# Patient Record
Sex: Female | Born: 1970 | Race: White | Hispanic: No | Smoking: Former smoker
Health system: Southern US, Community
[De-identification: ages and names within clinical notes are randomized; demographics above are authoritative.]

## PROBLEM LIST (undated history)

## (undated) DIAGNOSIS — I509 Heart failure, unspecified: Secondary | ICD-10-CM

## (undated) DIAGNOSIS — K589 Irritable bowel syndrome without diarrhea: Secondary | ICD-10-CM

## (undated) DIAGNOSIS — T7840XA Allergy, unspecified, initial encounter: Secondary | ICD-10-CM

## (undated) DIAGNOSIS — F1910 Other psychoactive substance abuse, uncomplicated: Secondary | ICD-10-CM

## (undated) DIAGNOSIS — F329 Major depressive disorder, single episode, unspecified: Secondary | ICD-10-CM

## (undated) DIAGNOSIS — F17200 Nicotine dependence, unspecified, uncomplicated: Secondary | ICD-10-CM

## (undated) DIAGNOSIS — Z8614 Personal history of Methicillin resistant Staphylococcus aureus infection: Secondary | ICD-10-CM

## (undated) DIAGNOSIS — IMO0002 Reserved for concepts with insufficient information to code with codable children: Secondary | ICD-10-CM

## (undated) DIAGNOSIS — E119 Type 2 diabetes mellitus without complications: Secondary | ICD-10-CM

## (undated) DIAGNOSIS — J309 Allergic rhinitis, unspecified: Secondary | ICD-10-CM

## (undated) DIAGNOSIS — Z87442 Personal history of urinary calculi: Secondary | ICD-10-CM

## (undated) DIAGNOSIS — D649 Anemia, unspecified: Secondary | ICD-10-CM

## (undated) DIAGNOSIS — G4733 Obstructive sleep apnea (adult) (pediatric): Secondary | ICD-10-CM

## (undated) DIAGNOSIS — F419 Anxiety disorder, unspecified: Secondary | ICD-10-CM

## (undated) DIAGNOSIS — I10 Essential (primary) hypertension: Secondary | ICD-10-CM

## (undated) DIAGNOSIS — B1710 Acute hepatitis C without hepatic coma: Secondary | ICD-10-CM

## (undated) DIAGNOSIS — F3289 Other specified depressive episodes: Secondary | ICD-10-CM

## (undated) DIAGNOSIS — G4730 Sleep apnea, unspecified: Secondary | ICD-10-CM

## (undated) DIAGNOSIS — D72829 Elevated white blood cell count, unspecified: Secondary | ICD-10-CM

## (undated) DIAGNOSIS — E871 Hypo-osmolality and hyponatremia: Secondary | ICD-10-CM

## (undated) DIAGNOSIS — S91331A Puncture wound without foreign body, right foot, initial encounter: Secondary | ICD-10-CM

## (undated) DIAGNOSIS — G43909 Migraine, unspecified, not intractable, without status migrainosus: Secondary | ICD-10-CM

## (undated) HISTORY — DX: Type 2 diabetes mellitus without complications: E11.9

## (undated) HISTORY — DX: Anemia, unspecified: D64.9

## (undated) HISTORY — DX: Acute hepatitis C without hepatic coma: B17.10

## (undated) HISTORY — DX: Major depressive disorder, single episode, unspecified: F32.9

## (undated) HISTORY — DX: Migraine, unspecified, not intractable, without status migrainosus: G43.909

## (undated) HISTORY — DX: Other psychoactive substance abuse, uncomplicated: F19.10

## (undated) HISTORY — DX: Reserved for concepts with insufficient information to code with codable children: IMO0002

## (undated) HISTORY — DX: Nicotine dependence, unspecified, uncomplicated: F17.200

## (undated) HISTORY — DX: Other specified depressive episodes: F32.89

## (undated) HISTORY — DX: Hypo-osmolality and hyponatremia: E87.1

## (undated) HISTORY — DX: Personal history of urinary calculi: Z87.442

## (undated) HISTORY — DX: Irritable bowel syndrome, unspecified: K58.9

## (undated) HISTORY — DX: Sleep apnea, unspecified: G47.30

## (undated) HISTORY — DX: Allergy, unspecified, initial encounter: T78.40XA

## (undated) HISTORY — DX: Allergic rhinitis, unspecified: J30.9

## (undated) HISTORY — DX: Essential (primary) hypertension: I10

## (undated) HISTORY — DX: Puncture wound without foreign body, right foot, initial encounter: S91.331A

## (undated) HISTORY — DX: Elevated white blood cell count, unspecified: D72.829

## (undated) HISTORY — DX: Anxiety disorder, unspecified: F41.9

## (undated) HISTORY — DX: Obstructive sleep apnea (adult) (pediatric): G47.33

---

## 1993-02-20 HISTORY — PX: TUBAL LIGATION: SHX77

## 1997-02-20 HISTORY — PX: LIVER BIOPSY: SHX301

## 2001-11-07 ENCOUNTER — Emergency Department (HOSPITAL_COMMUNITY): Admission: EM | Admit: 2001-11-07 | Discharge: 2001-11-08 | Payer: Self-pay | Admitting: Emergency Medicine

## 2002-02-14 ENCOUNTER — Encounter: Payer: Self-pay | Admitting: Emergency Medicine

## 2002-02-14 ENCOUNTER — Emergency Department (HOSPITAL_COMMUNITY): Admission: EM | Admit: 2002-02-14 | Discharge: 2002-02-14 | Payer: Self-pay | Admitting: Emergency Medicine

## 2002-03-01 ENCOUNTER — Emergency Department (HOSPITAL_COMMUNITY): Admission: EM | Admit: 2002-03-01 | Discharge: 2002-03-01 | Payer: Self-pay | Admitting: Emergency Medicine

## 2002-05-18 ENCOUNTER — Emergency Department (HOSPITAL_COMMUNITY): Admission: EM | Admit: 2002-05-18 | Discharge: 2002-05-18 | Payer: Self-pay | Admitting: *Deleted

## 2002-09-21 ENCOUNTER — Encounter (INDEPENDENT_AMBULATORY_CARE_PROVIDER_SITE_OTHER): Payer: Self-pay | Admitting: *Deleted

## 2002-10-02 ENCOUNTER — Emergency Department (HOSPITAL_COMMUNITY): Admission: EM | Admit: 2002-10-02 | Discharge: 2002-10-02 | Payer: Self-pay | Admitting: Emergency Medicine

## 2002-11-06 ENCOUNTER — Encounter: Admission: RE | Admit: 2002-11-06 | Discharge: 2002-11-06 | Payer: Self-pay | Admitting: Family Medicine

## 2002-11-24 ENCOUNTER — Encounter: Admission: RE | Admit: 2002-11-24 | Discharge: 2002-11-24 | Payer: Self-pay | Admitting: Family Medicine

## 2003-04-27 ENCOUNTER — Emergency Department (HOSPITAL_COMMUNITY): Admission: EM | Admit: 2003-04-27 | Discharge: 2003-04-27 | Payer: Self-pay | Admitting: Emergency Medicine

## 2003-10-01 ENCOUNTER — Emergency Department (HOSPITAL_COMMUNITY): Admission: EM | Admit: 2003-10-01 | Discharge: 2003-10-01 | Payer: Self-pay | Admitting: *Deleted

## 2004-06-10 ENCOUNTER — Other Ambulatory Visit: Admission: RE | Admit: 2004-06-10 | Discharge: 2004-06-10 | Payer: Self-pay | Admitting: Obstetrics and Gynecology

## 2005-02-09 ENCOUNTER — Encounter: Payer: Self-pay | Admitting: Internal Medicine

## 2005-02-20 HISTORY — PX: ABDOMINAL HYSTERECTOMY: SHX81

## 2005-04-21 ENCOUNTER — Emergency Department (HOSPITAL_COMMUNITY): Admission: EM | Admit: 2005-04-21 | Discharge: 2005-04-22 | Payer: Self-pay | Admitting: Emergency Medicine

## 2005-04-23 ENCOUNTER — Inpatient Hospital Stay (HOSPITAL_COMMUNITY): Admission: AD | Admit: 2005-04-23 | Discharge: 2005-04-23 | Payer: Self-pay | Admitting: Obstetrics and Gynecology

## 2005-05-05 ENCOUNTER — Encounter: Payer: Self-pay | Admitting: Internal Medicine

## 2005-05-15 ENCOUNTER — Ambulatory Visit: Admission: RE | Admit: 2005-05-15 | Discharge: 2005-05-15 | Payer: Self-pay | Admitting: Obstetrics & Gynecology

## 2005-06-07 ENCOUNTER — Ambulatory Visit: Payer: Self-pay | Admitting: Hematology and Oncology

## 2005-06-14 ENCOUNTER — Ambulatory Visit (HOSPITAL_COMMUNITY): Admission: RE | Admit: 2005-06-14 | Discharge: 2005-06-14 | Payer: Self-pay | Admitting: Hematology and Oncology

## 2005-06-14 LAB — CBC & DIFF AND RETIC
Basophils Absolute: 0.1 10*3/uL (ref 0.0–0.1)
EOS%: 2.7 % (ref 0.0–7.0)
Eosinophils Absolute: 0.4 10*3/uL (ref 0.0–0.5)
HCT: 33.9 % — ABNORMAL LOW (ref 34.8–46.6)
HGB: 11.1 g/dL — ABNORMAL LOW (ref 11.6–15.9)
MCH: 24.2 pg — ABNORMAL LOW (ref 26.0–34.0)
MCV: 73.8 fL — ABNORMAL LOW (ref 81.0–101.0)
MONO%: 8.6 % (ref 0.0–13.0)
NEUT#: 7.1 10*3/uL — ABNORMAL HIGH (ref 1.5–6.5)
NEUT%: 53.8 % (ref 39.6–76.8)
Platelets: 537 10*3/uL — ABNORMAL HIGH (ref 145–400)
RDW: 18.6 % — ABNORMAL HIGH (ref 11.3–14.5)
RETIC #: 106.5 10*3/uL (ref 19.7–115.1)

## 2005-06-14 LAB — COMPREHENSIVE METABOLIC PANEL
ALT: 18 U/L (ref 0–40)
AST: 21 U/L (ref 0–37)
Albumin: 3.2 g/dL — ABNORMAL LOW (ref 3.5–5.2)
Alkaline Phosphatase: 52 U/L (ref 39–117)
Calcium: 9.3 mg/dL (ref 8.4–10.5)
Chloride: 107 mEq/L (ref 96–112)
Potassium: 4.1 mEq/L (ref 3.5–5.3)
Sodium: 138 mEq/L (ref 135–145)
Total Protein: 7 g/dL (ref 6.0–8.3)

## 2005-06-15 LAB — LUPUS ANTICOAGULANT PANEL
DRVVT: 42.3 secs (ref 26.75–42.95)
PTT Lupus Anticoagulant: 48 secs — ABNORMAL HIGH (ref 30.5–43.1)
PTTLA 4:1 Mix: 45.7 secs — ABNORMAL HIGH (ref 30.5–43.1)

## 2005-06-15 LAB — LEUKOCYTE ALKALINE PHOS: Leukocyte Alkaline  Phos Stain: 182 — ABNORMAL HIGH (ref 33–149)

## 2005-06-15 LAB — HEPATITIS B SURFACE ANTIGEN: Hepatitis B Surface Ag: NEGATIVE

## 2005-06-15 LAB — FOLATE: Folate: 14.9 ng/mL

## 2005-06-15 LAB — VITAMIN B12: Vitamin B-12: 691 pg/mL (ref 211–911)

## 2005-06-15 LAB — HEPATITIS B SURFACE ANTIBODY,QUALITATIVE: Hep B S Ab: NEGATIVE

## 2005-07-14 LAB — IRON AND TIBC
Iron: 82 ug/dL (ref 42–145)
UIBC: 237 ug/dL

## 2005-07-14 LAB — CBC WITH DIFFERENTIAL/PLATELET
BASO%: 0.5 % (ref 0.0–2.0)
EOS%: 2.3 % (ref 0.0–7.0)
Eosinophils Absolute: 0.3 10*3/uL (ref 0.0–0.5)
LYMPH%: 29.1 % (ref 14.0–48.0)
MCHC: 33.1 g/dL (ref 32.0–36.0)
MCV: 79.3 fL — ABNORMAL LOW (ref 81.0–101.0)
MONO%: 6.2 % (ref 0.0–13.0)
NEUT#: 9.1 10*3/uL — ABNORMAL HIGH (ref 1.5–6.5)
Platelets: 389 10*3/uL (ref 145–400)
RBC: 4.88 10*6/uL (ref 3.70–5.32)
RDW: 26.2 % — ABNORMAL HIGH (ref 11.3–14.5)

## 2005-07-14 LAB — FERRITIN: Ferritin: 493 ng/mL — ABNORMAL HIGH (ref 10–291)

## 2005-08-03 ENCOUNTER — Encounter (INDEPENDENT_AMBULATORY_CARE_PROVIDER_SITE_OTHER): Payer: Self-pay | Admitting: *Deleted

## 2005-08-03 ENCOUNTER — Ambulatory Visit (HOSPITAL_COMMUNITY): Admission: RE | Admit: 2005-08-03 | Discharge: 2005-08-04 | Payer: Self-pay | Admitting: *Deleted

## 2005-08-03 DIAGNOSIS — N852 Hypertrophy of uterus: Secondary | ICD-10-CM

## 2006-04-19 DIAGNOSIS — K589 Irritable bowel syndrome without diarrhea: Secondary | ICD-10-CM | POA: Insufficient documentation

## 2006-04-19 DIAGNOSIS — F17200 Nicotine dependence, unspecified, uncomplicated: Secondary | ICD-10-CM

## 2006-04-19 DIAGNOSIS — K754 Autoimmune hepatitis: Secondary | ICD-10-CM | POA: Insufficient documentation

## 2006-04-19 DIAGNOSIS — G43909 Migraine, unspecified, not intractable, without status migrainosus: Secondary | ICD-10-CM | POA: Insufficient documentation

## 2006-04-19 DIAGNOSIS — K759 Inflammatory liver disease, unspecified: Secondary | ICD-10-CM | POA: Insufficient documentation

## 2006-04-20 ENCOUNTER — Encounter (INDEPENDENT_AMBULATORY_CARE_PROVIDER_SITE_OTHER): Payer: Self-pay | Admitting: *Deleted

## 2007-03-22 ENCOUNTER — Encounter: Payer: Self-pay | Admitting: Internal Medicine

## 2007-04-01 ENCOUNTER — Encounter: Payer: Self-pay | Admitting: Internal Medicine

## 2007-04-03 ENCOUNTER — Encounter: Payer: Self-pay | Admitting: Internal Medicine

## 2007-04-08 ENCOUNTER — Encounter: Payer: Self-pay | Admitting: Internal Medicine

## 2007-04-08 LAB — CONVERTED CEMR LAB
Basophils Relative: 0 %
HCT: 47.5 %
Hemoglobin: 15.9 g/dL
Lymphocytes, automated: 33 %
MCV: 95.8 fL
Platelets: 318 10*3/uL
RBC: 4.96 M/uL
WBC: 14 10*3/uL

## 2007-05-21 ENCOUNTER — Encounter: Payer: Self-pay | Admitting: Pulmonary Disease

## 2008-01-26 ENCOUNTER — Emergency Department (HOSPITAL_COMMUNITY): Admission: EM | Admit: 2008-01-26 | Discharge: 2008-01-26 | Payer: Self-pay | Admitting: Emergency Medicine

## 2008-05-01 ENCOUNTER — Encounter: Payer: Self-pay | Admitting: Internal Medicine

## 2008-05-01 LAB — CONVERTED CEMR LAB
Alkaline Phosphatase: 67 units/L
BUN: 10 mg/dL
Basophils Relative: 1 %
Calcium: 9.2 mg/dL
Chloride: 105 meq/L
Cholesterol: 131 mg/dL
Creatinine, Ser: 0.85 mg/dL
Eosinophils Relative: 4 %
Glucose, Bld: 112 mg/dL
HCT: 46.8 %
Hemoglobin: 15.2 g/dL
RBC: 5 M/uL
RDW: 14.1 %
TSH: 0.563 microintl units/mL
Total Bilirubin: 0.4 mg/dL
Total Protein: 7.2 g/dL

## 2008-06-24 ENCOUNTER — Emergency Department (HOSPITAL_COMMUNITY): Admission: EM | Admit: 2008-06-24 | Discharge: 2008-06-24 | Payer: Self-pay | Admitting: Emergency Medicine

## 2008-10-22 ENCOUNTER — Encounter: Payer: Self-pay | Admitting: Internal Medicine

## 2008-10-22 LAB — CONVERTED CEMR LAB
ALT: 29 units/L
BUN: 19 mg/dL
Chloride: 106 meq/L
Hemoglobin: 14 g/dL
Lymphocytes, automated: 31 %
Monocytes Relative: 1.3 %
Platelets: 322 10*3/uL
Potassium: 4.2 meq/L
RBC: 4.48 M/uL
TSH: 0.906 microintl units/mL
WBC: 17 10*3/uL

## 2008-10-29 ENCOUNTER — Emergency Department (HOSPITAL_COMMUNITY): Admission: EM | Admit: 2008-10-29 | Discharge: 2008-10-29 | Payer: Self-pay | Admitting: Emergency Medicine

## 2008-10-29 ENCOUNTER — Encounter: Payer: Self-pay | Admitting: Internal Medicine

## 2008-10-29 DIAGNOSIS — M549 Dorsalgia, unspecified: Secondary | ICD-10-CM | POA: Insufficient documentation

## 2008-10-29 LAB — CONVERTED CEMR LAB
BUN: 20 mg/dL
Basophils Relative: 1 %
Chloride: 99 meq/L
Creatinine, Ser: 0.96 mg/dL
Glucose, Bld: 122 mg/dL
Hemoglobin: 15.3 g/dL
Lymphocytes, automated: 27 %
MCV: 93.5 fL
Nitrite: NEGATIVE
Platelets: 317 10*3/uL
Protein, U semiquant: NEGATIVE
RBC: 4.72 M/uL
Specific Gravity, Urine: 1.021
WBC: 19.2 10*3/uL
pH: 7

## 2008-10-30 ENCOUNTER — Telehealth (INDEPENDENT_AMBULATORY_CARE_PROVIDER_SITE_OTHER): Payer: Self-pay | Admitting: *Deleted

## 2008-11-02 ENCOUNTER — Encounter: Payer: Self-pay | Admitting: Internal Medicine

## 2008-11-02 DIAGNOSIS — Z9889 Other specified postprocedural states: Secondary | ICD-10-CM

## 2008-11-02 DIAGNOSIS — J40 Bronchitis, not specified as acute or chronic: Secondary | ICD-10-CM

## 2008-11-02 DIAGNOSIS — J189 Pneumonia, unspecified organism: Secondary | ICD-10-CM

## 2008-11-02 DIAGNOSIS — Z87442 Personal history of urinary calculi: Secondary | ICD-10-CM

## 2008-11-02 DIAGNOSIS — G4730 Sleep apnea, unspecified: Secondary | ICD-10-CM | POA: Insufficient documentation

## 2008-11-02 DIAGNOSIS — I10 Essential (primary) hypertension: Secondary | ICD-10-CM | POA: Insufficient documentation

## 2008-11-03 ENCOUNTER — Ambulatory Visit: Payer: Self-pay | Admitting: Internal Medicine

## 2008-11-03 DIAGNOSIS — F331 Major depressive disorder, recurrent, moderate: Secondary | ICD-10-CM | POA: Insufficient documentation

## 2008-11-03 DIAGNOSIS — D72829 Elevated white blood cell count, unspecified: Secondary | ICD-10-CM | POA: Insufficient documentation

## 2008-11-03 DIAGNOSIS — J309 Allergic rhinitis, unspecified: Secondary | ICD-10-CM | POA: Insufficient documentation

## 2008-11-03 DIAGNOSIS — R319 Hematuria, unspecified: Secondary | ICD-10-CM

## 2008-11-03 DIAGNOSIS — F329 Major depressive disorder, single episode, unspecified: Secondary | ICD-10-CM

## 2008-11-03 DIAGNOSIS — R1909 Other intra-abdominal and pelvic swelling, mass and lump: Secondary | ICD-10-CM

## 2008-11-11 ENCOUNTER — Ambulatory Visit: Payer: Self-pay | Admitting: Internal Medicine

## 2008-11-11 ENCOUNTER — Telehealth (INDEPENDENT_AMBULATORY_CARE_PROVIDER_SITE_OTHER): Payer: Self-pay | Admitting: *Deleted

## 2008-11-11 DIAGNOSIS — M545 Low back pain: Secondary | ICD-10-CM

## 2008-11-13 ENCOUNTER — Encounter: Payer: Self-pay | Admitting: Internal Medicine

## 2008-11-16 ENCOUNTER — Encounter: Payer: Self-pay | Admitting: Internal Medicine

## 2008-11-16 ENCOUNTER — Telehealth: Payer: Self-pay | Admitting: Internal Medicine

## 2008-11-16 DIAGNOSIS — B1710 Acute hepatitis C without hepatic coma: Secondary | ICD-10-CM | POA: Insufficient documentation

## 2008-11-20 ENCOUNTER — Telehealth: Payer: Self-pay | Admitting: Internal Medicine

## 2008-11-25 ENCOUNTER — Encounter: Payer: Self-pay | Admitting: Internal Medicine

## 2008-12-07 ENCOUNTER — Ambulatory Visit: Payer: Self-pay | Admitting: Internal Medicine

## 2008-12-07 DIAGNOSIS — E871 Hypo-osmolality and hyponatremia: Secondary | ICD-10-CM

## 2008-12-07 LAB — CONVERTED CEMR LAB
BUN: 17 mg/dL (ref 6–23)
Basophils Relative: 0.7 % (ref 0.0–3.0)
CO2: 27 meq/L (ref 19–32)
Calcium: 9.2 mg/dL (ref 8.4–10.5)
Eosinophils Relative: 3.2 % (ref 0.0–5.0)
GFR calc non Af Amer: 84.36 mL/min (ref >60)
Glucose, Bld: 214 mg/dL — ABNORMAL HIGH (ref 70–99)
HCT: 46.3 % — ABNORMAL HIGH (ref 36.0–46.0)
Hemoglobin: 15.8 g/dL — ABNORMAL HIGH (ref 12.0–15.0)
Lymphs Abs: 4.2 10*3/uL — ABNORMAL HIGH (ref 0.7–4.0)
MCV: 94.8 fL (ref 78.0–100.0)
Monocytes Absolute: 0.9 10*3/uL (ref 0.1–1.0)
Monocytes Relative: 6.7 % (ref 3.0–12.0)
Potassium: 3.8 meq/L (ref 3.5–5.1)
RBC: 4.89 M/uL (ref 3.87–5.11)
WBC: 14 10*3/uL — ABNORMAL HIGH (ref 4.5–10.5)

## 2008-12-14 ENCOUNTER — Ambulatory Visit: Payer: Self-pay | Admitting: Internal Medicine

## 2008-12-16 ENCOUNTER — Telehealth: Payer: Self-pay | Admitting: Internal Medicine

## 2008-12-16 ENCOUNTER — Ambulatory Visit: Payer: Self-pay | Admitting: Pulmonary Disease

## 2008-12-16 DIAGNOSIS — G4733 Obstructive sleep apnea (adult) (pediatric): Secondary | ICD-10-CM

## 2008-12-16 DIAGNOSIS — F518 Other sleep disorders not due to a substance or known physiological condition: Secondary | ICD-10-CM | POA: Insufficient documentation

## 2008-12-21 LAB — CBC WITH DIFFERENTIAL/PLATELET
BASO%: 0.4 % (ref 0.0–2.0)
Basophils Absolute: 0.1 10*3/uL (ref 0.0–0.1)
HCT: 43.7 % (ref 34.8–46.6)
HGB: 15 g/dL (ref 11.6–15.9)
MONO#: 1 10*3/uL — ABNORMAL HIGH (ref 0.1–0.9)
NEUT%: 63.4 % (ref 38.4–76.8)
RDW: 13.5 % (ref 11.2–14.5)
WBC: 14.5 10*3/uL — ABNORMAL HIGH (ref 3.9–10.3)
lymph#: 4.1 10*3/uL — ABNORMAL HIGH (ref 0.9–3.3)

## 2008-12-22 ENCOUNTER — Ambulatory Visit: Payer: Self-pay | Admitting: Internal Medicine

## 2008-12-23 LAB — COMPREHENSIVE METABOLIC PANEL
ALT: 27 U/L (ref 0–35)
Albumin: 3.8 g/dL (ref 3.5–5.2)
BUN: 17 mg/dL (ref 6–23)
CO2: 23 mEq/L (ref 19–32)
Calcium: 9.4 mg/dL (ref 8.4–10.5)
Chloride: 103 mEq/L (ref 96–112)
Creatinine, Ser: 0.82 mg/dL (ref 0.40–1.20)
Potassium: 4.4 mEq/L (ref 3.5–5.3)

## 2008-12-23 LAB — LACTATE DEHYDROGENASE: LDH: 183 U/L (ref 94–250)

## 2009-01-12 ENCOUNTER — Encounter: Admission: RE | Admit: 2009-01-12 | Discharge: 2009-01-12 | Payer: Self-pay | Admitting: Gynecology

## 2009-02-09 ENCOUNTER — Telehealth: Payer: Self-pay | Admitting: Internal Medicine

## 2009-02-26 ENCOUNTER — Encounter (INDEPENDENT_AMBULATORY_CARE_PROVIDER_SITE_OTHER): Payer: Self-pay | Admitting: *Deleted

## 2009-02-26 ENCOUNTER — Ambulatory Visit: Payer: Self-pay | Admitting: Internal Medicine

## 2009-02-27 ENCOUNTER — Encounter: Payer: Self-pay | Admitting: Pulmonary Disease

## 2009-03-19 ENCOUNTER — Ambulatory Visit: Payer: Self-pay | Admitting: Internal Medicine

## 2009-03-19 ENCOUNTER — Encounter: Payer: Self-pay | Admitting: Internal Medicine

## 2009-03-22 ENCOUNTER — Encounter: Payer: Self-pay | Admitting: Internal Medicine

## 2009-03-23 LAB — CBC WITH DIFFERENTIAL/PLATELET
BASO%: 0.4 % (ref 0.0–2.0)
Basophils Absolute: 0 10*3/uL (ref 0.0–0.1)
EOS%: 2.4 % (ref 0.0–7.0)
HCT: 45.3 % (ref 34.8–46.6)
HGB: 15.6 g/dL (ref 11.6–15.9)
LYMPH%: 27.2 % (ref 14.0–49.7)
MCH: 32.2 pg (ref 25.1–34.0)
MCHC: 34.5 g/dL (ref 31.5–36.0)
MCV: 93.5 fL (ref 79.5–101.0)
MONO%: 11 % (ref 0.0–14.0)
NEUT%: 59 % (ref 38.4–76.8)
lymph#: 2.3 10*3/uL (ref 0.9–3.3)

## 2009-03-24 ENCOUNTER — Ambulatory Visit: Payer: Self-pay | Admitting: Internal Medicine

## 2009-03-24 ENCOUNTER — Encounter (INDEPENDENT_AMBULATORY_CARE_PROVIDER_SITE_OTHER): Payer: Self-pay | Admitting: *Deleted

## 2009-03-28 ENCOUNTER — Ambulatory Visit: Payer: Self-pay | Admitting: Internal Medicine

## 2009-09-29 ENCOUNTER — Ambulatory Visit: Payer: Self-pay | Admitting: Internal Medicine

## 2009-09-29 ENCOUNTER — Encounter (INDEPENDENT_AMBULATORY_CARE_PROVIDER_SITE_OTHER): Payer: Self-pay | Admitting: *Deleted

## 2009-09-29 DIAGNOSIS — E119 Type 2 diabetes mellitus without complications: Secondary | ICD-10-CM

## 2009-09-29 DIAGNOSIS — E1142 Type 2 diabetes mellitus with diabetic polyneuropathy: Secondary | ICD-10-CM | POA: Insufficient documentation

## 2009-09-29 LAB — CONVERTED CEMR LAB
BUN: 11 mg/dL (ref 6–23)
Chloride: 99 meq/L (ref 96–112)
Creatinine, Ser: 0.8 mg/dL (ref 0.4–1.2)
Glucose, Bld: 313 mg/dL — ABNORMAL HIGH (ref 70–99)
Hgb A1c MFr Bld: 10.2 % — ABNORMAL HIGH (ref 4.6–6.5)
Potassium: 4.5 meq/L (ref 3.5–5.1)

## 2009-09-30 ENCOUNTER — Telehealth: Payer: Self-pay | Admitting: Internal Medicine

## 2009-09-30 ENCOUNTER — Ambulatory Visit: Payer: Self-pay | Admitting: Internal Medicine

## 2009-11-15 ENCOUNTER — Ambulatory Visit: Payer: Self-pay | Admitting: Internal Medicine

## 2009-11-22 ENCOUNTER — Encounter (INDEPENDENT_AMBULATORY_CARE_PROVIDER_SITE_OTHER): Payer: Self-pay | Admitting: *Deleted

## 2009-11-22 ENCOUNTER — Telehealth: Payer: Self-pay | Admitting: Internal Medicine

## 2009-12-22 ENCOUNTER — Ambulatory Visit: Payer: Self-pay | Admitting: Internal Medicine

## 2010-01-17 ENCOUNTER — Encounter: Payer: Self-pay | Admitting: Internal Medicine

## 2010-01-17 ENCOUNTER — Encounter
Admission: RE | Admit: 2010-01-17 | Discharge: 2010-03-22 | Payer: Self-pay | Source: Home / Self Care | Attending: Internal Medicine | Admitting: Internal Medicine

## 2010-03-14 ENCOUNTER — Encounter: Payer: Self-pay | Admitting: Specialist

## 2010-03-24 NOTE — Assessment & Plan Note (Signed)
Summary: headaches/anxiety/cd   Vital Signs:  Patient profile:   40 year old female Height:      64 inches (162.56 cm) Weight:      272.0 pounds (123.64 kg) BMI:     46.86 O2 Sat:      96 % on Room air Temp:     98.6 degrees F (37.00 degrees C) oral Pulse rate:   80 / minute BP sitting:   110 / 78  (left arm) Cuff size:   large  Vitals Entered By: Orlan Leavens RMA (September 29, 2009 10:17 AM)  O2 Flow:  Room air CC: Headaches & Anxiety Is Patient Diabetic? No   Primary Care Ciena Sampley:  Newt Lukes MD  CC:  Headaches & Anxiety.  History of Present Illness: multiple concerns -  recurrent pulmonary infections every fall-winter-spring illness requiring frequent missed days from work -  work requires pt to [employer] as four indivdiually mised occurences is grounds for dismissal has in past has FLMA state pt to be out 2-3d every other week would like to continue this FMLA policy given upcoming "resp illness season" though feels ok today has not seen pulm at any point for eval of same  bipolar hx -  well controlled but states she also uses FMLA days for times when bipolar "not so good" no recent med changes  migraines - fewer episodes since being on CPAP will use FMLA time during bad days increase and recent flare related to stress: boyfriend unemployed, boyfriend dad ill and may be moving in  LBP - currently symptoms increased due to spasm due to stress reviewed visit with dr. Alveda Reasons -  pt understands weight is contributing to strain on back and will work on weight loss  leukocytosis - still no primary dx - las seen by heme 3 yrs ago - ?if this contrib to her recurrent infections  hyponatremia -  pt unsure of exact dx until now "but i know they told me not to drink water so much" long hx same - avoids salt use in diet because of HTN hx  Clinical Review Panels:  Lipid Management   Cholesterol:  131 (05/01/2008)   LDL (bad choesterol):  23 (05/01/2008)  HDL (good cholesterol):  28 (05/01/2008)   Triglycerides:  117 (05/01/2008)  CBC   WBC:  14.0 (12/07/2008)   RBC:  4.89 (12/07/2008)   Hgb:  15.8 (12/07/2008)   Hct:  46.3 (12/07/2008)   Platelets:  310.0 (12/07/2008)   MCV  94.8 (12/07/2008)   MCHC  34.0 (12/07/2008)   RDW  12.7 (12/07/2008)   PMN:  59.7 (12/07/2008)   Lymphs:  29.7 (12/07/2008)   Monos:  6.7 (12/07/2008)   Eosinophils:  3.2 (12/07/2008)   Basophil:  0.7 (12/07/2008)  Complete Metabolic Panel   Glucose:  214 (12/07/2008)   Sodium:  139 (12/07/2008)   Potassium:  3.8 (12/07/2008)   Chloride:  102 (12/07/2008)   CO2:  27 (12/07/2008)   BUN:  17 (12/07/2008)   Creatinine:  0.8 (12/07/2008)   Albumin:  3.8 (05/01/2008)   Total Protein:  7.2 (05/01/2008)   Calcium:  9.2 (12/07/2008)   Total Bili:  0.3 (10/22/2008)   Alk Phos:  65 (10/22/2008)   SGPT (ALT):  29 (10/22/2008)   SGOT (AST):  18 (10/22/2008)   Current Medications (verified): 1)  Lasix 20 Mg Tabs (Furosemide) .... Take 1 By Mouth Qd 2)  Lisinopril-Hydrochlorothiazide 10-12.5 Mg Tabs (Lisinopril-Hydrochlorothiazide) .... Takw 1 By Mouth Qd 3)  Robaxin  500 Mg Tabs (Methocarbamol) .Marland Kitchen.. 1 By Mouth Q8h As Needed Muscle Spasm 4)  Gabapentin 600 Mg Tabs (Gabapentin) .... Take 1/2 For 6 Days Then 1 Once Daily 5)  Bc Fast Pain Relief Arthritis 1000-65 Mg Pack (Aspirin-Caffeine) .... Take As Needed  Allergies (verified): 1)  ! Erythromycin 2)  ! Penicillin 3)  ! * Ginsing 4)  ! * Walnuts 5)  ! * Mushroom 6)  ! * Pineapple  Past History:  Past Medical History: Hypertension Allergic rhinitis Depression High white count - NOS - odogwu hematuria - NOS  hx multiple substance abuse (alcohol, cocaine, MJ) -clean since 2004 Hepatitis C hx with liver failure - now +ab, -ag OSA  MD roster -  ortho spine - [facility] - [place]  Review of Systems  The patient denies weight gain, chest pain, dyspnea on exertion, abdominal pain, incontinence, and  difficulty walking.    Physical Exam  General:  alert, well-developed, well-nourished, and cooperative to examination.   tired Lungs:  normal respiratory effort, no intercostal retractions or use of accessory muscles; normal breath sounds bilaterally - no crackles and no wheezes.    Heart:  normal rate, regular rhythm, no murmur, and no rub. BLE without edema.  Psych:  Oriented X3, memory intact for recent and remote, normally interactive, good eye contact, mildly anxious appearing, not depressed appearing, and not agitated.      Impression & Recommendations:  Problem # 1:  HYPERGLYCEMIA (ICD-790.29)  noted random cbg >200 on 11/2008 labs - check Bmet and a1c now - start metformin if DM confirmed  Labs Reviewed: Creat: 0.8 (12/07/2008)     Orders: TLB-BMP (Basic Metabolic Panel-BMET) (80048-METABOL) TLB-A1C / Hgb A1C (Glycohemoglobin) (83036-A1C)  Problem # 2:  MIGRAINE, UNSPEC., W/O INTRACTABLE MIGRAINE (ICD-346.90)  Her updated medication list for this problem includes:    Bc Fast Pain Relief Arthritis 1000-65 Mg Pack (Aspirin-caffeine) .Marland Kitchen... Take as needed  no narcs from this office for chronic problems - s/p eval by [facility] - pt expresses understanding work note provided for time missed this week  Complete Medication List: 1)  Lasix 20 Mg Tabs (Furosemide) .... Take 1 by mouth qd 2)  Lisinopril-hydrochlorothiazide 10-12.5 Mg Tabs (Lisinopril-hydrochlorothiazide) .... Takw 1 by mouth qd 3)  Robaxin 500 Mg Tabs (Methocarbamol) .Marland Kitchen.. 1 by mouth q8h as needed muscle spasm 4)  Gabapentin 600 Mg Tabs (Gabapentin) .... Take 1/2 for 6 days then 1 once daily 5)  Bc Fast Pain Relief Arthritis 1000-65 Mg Pack (Aspirin-caffeine) .... Take as needed  Patient Instructions: 1)  it was good to see you today. 2)  work note provided as disucssed 3)  test(s) ordered today - your results will be called to you in 48-72 hours from the time of test completion; if any medication changes need  to be made or there are abnormal results, you will be notified of same at this time 4)  Please schedule a follow-up appointment in 3-6 months, sooner if problems.

## 2010-03-24 NOTE — Progress Notes (Signed)
Summary: Rx refill       New/Updated Medications: ONETOUCH ULTRA TEST  STRP (GLUCOSE BLOOD) Use as directed two times a day ONETOUCH ULTRASOFT LANCETS  MISC (LANCETS) use as directed two times a day Prescriptions: ONETOUCH ULTRASOFT LANCETS  MISC (LANCETS) use as directed two times a day  #100 x 11   Entered by:   Margaret Pyle, CMA   Authorized by:   Newt Lukes MD   Signed by:   Margaret Pyle, CMA on 09/30/2009   Method used:   Electronically to        [facility] [address] (retail)       [phone]       [place], [place]  [address]       Ph: [phone]       Fax: [phone]   RxID:   [id number] [place] TEST  STRP (GLUCOSE BLOOD) Use as directed two times a day  #100 x 11   Entered by:   Margaret Pyle, CMA   Authorized by:   Newt Lukes MD   Signed by:   Margaret Pyle, CMA on 09/30/2009   Method used:   Electronically to        [facility] [address] (retail)       [phone]       [place], [place]  [address]       Ph: [phone]       Fax: [phone]   RxID:   [id number]

## 2010-03-24 NOTE — Assessment & Plan Note (Signed)
Summary: head cold?/cd   Vital Signs:  Patient profile:   40 year old female Height:      64 inches (162.56 cm) Weight:      279 pounds (126.82 kg) O2 Sat:      96 % on Room air Temp:     98.7 degrees F (37.06 degrees C) oral Pulse rate:   94 / minute BP sitting:   120 / 62  (left arm) Cuff size:   large  Vitals Entered By: Orlan Leavens (March 24, 2009 1:46 PM)  O2 Flow:  Room air CC: head cold/ cough Is Patient Diabetic? No Pain Assessment Patient in pain? no        Primary Care Provider:  Newt Lukes MD  CC:  head cold/ cough.  History of Present Illness: here today with complaint of chest congestion and "cold". onset of symptoms was 3 days ago. course has been sudden onset and now occurs in constant progressive pattern. problem precipitated by sick contact at work - symptom characterized as head congestion and constant cough "nagging" problem associated with thin yellow sputum and diaophoresis but not associated with fever or HA. symptoms improved by nothing - trying OTC meds w/o relief. symptoms worsened with talking and lying flat. + prior hx of same symptoms.   Current Medications (verified): 1)  Lasix 20 Mg Tabs (Furosemide) .... Take 1 By Mouth Qd 2)  Lisinopril-Hydrochlorothiazide 10-12.5 Mg Tabs (Lisinopril-Hydrochlorothiazide) .... Takw 1 By Mouth Qd 3)  Robaxin 500 Mg Tabs (Methocarbamol) .Marland Kitchen.. 1 By Mouth Q8h As Needed Muscle Spasm 4)  Gabapentin 600 Mg Tabs (Gabapentin) .... Take 1/2 For 6 Days Then 1 Once Daily  Allergies (verified): 1)  ! Erythromycin 2)  ! Penicillin 3)  ! * Ginsing 4)  ! * Walnuts 5)  ! * Mushroom 6)  ! * Pineapple  Past History:  Past Medical History: Last updated: 02/26/2009 Hypertension Allergic rhinitis Depression High white count - NOS - odogwu hematuria - NOS hx multiple substance abuse (alcohol, cocaine, MJ) -clean since 2004 Hepatitis C hx with liver failure - now +ab, -ag OSA  MD rooster -  ortho  spine - [facility] - [facility]  Review of Systems  The patient denies anorexia, vision loss, hoarseness, chest pain, prolonged cough, hemoptysis, and abdominal pain.    Physical Exam  General:  alert, well-developed, well-nourished, and cooperative to examination.   diaphoretic and tired Eyes:  vision grossly intact; pupils equal, round and reactive to light.  conjunctiva and lids normal.    Ears:  normal pinnae bilaterally, without erythema, swelling, or tenderness to palpation. Hearing grossly normal bilaterally -TMs clear Mouth:  teeth and gums in good repair; mucous membranes moist, without lesions or ulcers. oropharynx clear without exudate, mod erythema.  Lungs:  few scattered rhonchi bilaterally - no wheeze - equal air mvmt B Heart:  normal rate, regular rhythm, no murmur, and no rub. BLE without edema.    Impression & Recommendations:  Problem # 1:  ACUTE BRONCHITIS (ICD-466.0)  Her updated medication list for this problem includes:    Azithromycin 250 Mg Tabs (Azithromycin) .Marland Kitchen... 2 tabs by mouth today, then 1 by mouth daily starting tomorrow    Promethazine Vc/codeine 6.25-5-10 Mg/31ml Syrp (Phenyleph-promethazine-cod) .Marland KitchenMarland KitchenMarland KitchenMarland Kitchen 5 cc by mouth every 4 hours as needed for cough  work excuse provided -  abx, symptoms tx discussed Take antibiotics and other medications as directed. Encouraged to push clear liquids, get enough rest, and take acetaminophen as needed. To  be seen in 5-7 days if no improvement, sooner if worse.  Orders: Prescription Created Electronically [id number])  Complete Medication List: 1)  Lasix 20 Mg Tabs (Furosemide) .... Take 1 by mouth qd 2)  Lisinopril-hydrochlorothiazide 10-12.5 Mg Tabs (Lisinopril-hydrochlorothiazide) .... Takw 1 by mouth qd 3)  Robaxin 500 Mg Tabs (Methocarbamol) .Marland Kitchen.. 1 by mouth q8h as needed muscle spasm 4)  Gabapentin 600 Mg Tabs (Gabapentin) .... Take 1/2 for 6 days then 1 once daily 5)  Azithromycin 250 Mg Tabs (Azithromycin) .... 2 tabs  by mouth today, then 1 by mouth daily starting tomorrow 6)  Promethazine Vc/codeine 6.25-5-10 Mg/83ml Syrp (Phenyleph-promethazine-cod) .... 5 cc by mouth every 4 hours as needed for cough  Patient Instructions: 1)  it was good to see you today.  2)  antibiotics and cough medications as discussed - your prescriptions have been electronically submitted to your pharmacy (except cough syrup). Please take as directed. Contact our office if you believe you're having problems with the medication(s).  3)  work note provided as requested - 4)  Please schedule a follow-up appointment as scheduled previously, sooner if problems.  Prescriptions: PROMETHAZINE VC/CODEINE 6.25-5-10 MG/5ML SYRP (PHENYLEPH-PROMETHAZINE-COD) 5 cc by mouth every 4 hours as needed for cough  #100cc x 0   Entered and Authorized by:   Newt Lukes MD   Signed by:   Newt Lukes MD on 03/24/2009   Method used:   Print then Give to Patient   RxID:   [id number] AZITHROMYCIN 250 MG TABS (AZITHROMYCIN) 2 tabs by mouth today, then 1 by mouth daily starting tomorrow  #6 x 0   Entered and Authorized by:   Newt Lukes MD   Signed by:   Newt Lukes MD on 03/24/2009   Method used:   Electronically to        [facility] [address] (retail)       [phone]       [place], [place]  [address]       Ph: [phone]       Fax: [phone]   RxID:   [id number]

## 2010-03-24 NOTE — Letter (Signed)
Summary: Out of Work  [facility]  [address] [place], [place] [address]   Phone: [phone]  Fax: [phone]    November 15, 2009   Employee:  AMOY STEEVES    To Whom It May Concern:   For Medical reasons, please excuse the above named employee from work for the following dates:  Start:   11/15/09  End:   11/21/09, return 11/22/09 to work  If you need additional information, please feel free to contact our office.         Sincerely,    Newt Lukes MD

## 2010-03-24 NOTE — Assessment & Plan Note (Signed)
Summary: chest cold/#/cd   Vital Signs:  Patient profile:   40 year old female Height:      64 inches (162.56 cm) Weight:      270.0 pounds (122.73 kg) O2 Sat:      96 % on Room air Temp:     98.8 degrees F (37.11 degrees C) oral Pulse rate:   87 / minute BP sitting:   102 / 72  (left arm) Cuff size:   large  Vitals Entered By: Orlan Leavens RMA (November 15, 2009 10:28 AM)  O2 Flow:  Room air CC: Chest cold Is Patient Diabetic? Yes Did you bring your meter with you today? No Pain Assessment Patient in pain? no        Primary Care Cassandre Oleksy:  Newt Lukes MD  CC:  Chest cold.  History of Present Illness: here c/o chest cold - onset 3 days ago -  recurrent pulmonary infections every fall-winter-spring a/w productive cough and LGF - no relief with OTC meds - illness requiring frequent missed days from work -  work requires pt to [employer] as four indivdiually mised occurences is grounds for dismissal has in past has FLMA state pt to be out 2-3d every other week would like to continue this FMLA policy given upcoming "resp illness season"  has not seen pulm at any point for eval of same  bipolar hx -  well controlled but states she also uses FMLA days for times when bipolar "not so good" no recent med changes  migraines - fewer episodes since being on CPAP will use FMLA time during bad days increase and recent flare related to stress: boyfriend unemployed, boyfriend dad ill and may be moving in  LBP - currently symptoms increased due to spasm due to stress reviewed visit with dr. Alveda Reasons -  pt understands weight is contributing to strain on back and will work on weight loss  leukocytosis - still no primary dx - las seen by heme 3 yrs ago - ?if this contrib to her recurrent infections  hyponatremia -  pt unsure of exact dx until now "but i know they told me not to drink water so much" long hx same - avoids salt use in diet because of HTN hx  Clinical  Review Panels:  Immunizations   Last Tetanus Booster:  Historical (02/20/2002)  Lipid Management   Cholesterol:  131 (05/01/2008)   LDL (bad choesterol):  23 (05/01/2008)   HDL (good cholesterol):  28 (05/01/2008)   Triglycerides:  117 (05/01/2008)  Diabetes Management   HgBA1C:  10.2 (09/29/2009)   Creatinine:  0.8 (09/29/2009)  CBC   WBC:  14.0 (12/07/2008)   RBC:  4.89 (12/07/2008)   Hgb:  15.8 (12/07/2008)   Hct:  46.3 (12/07/2008)   Platelets:  310.0 (12/07/2008)   MCV  94.8 (12/07/2008)   MCHC  34.0 (12/07/2008)   RDW  12.7 (12/07/2008)   PMN:  59.7 (12/07/2008)   Lymphs:  29.7 (12/07/2008)   Monos:  6.7 (12/07/2008)   Eosinophils:  3.2 (12/07/2008)   Basophil:  0.7 (12/07/2008)  Complete Metabolic Panel   Glucose:  313 (09/29/2009)   Sodium:  136 (09/29/2009)   Potassium:  4.5 (09/29/2009)   Chloride:  99 (09/29/2009)   CO2:  28 (09/29/2009)   BUN:  11 (09/29/2009)   Creatinine:  0.8 (09/29/2009)   Albumin:  3.8 (05/01/2008)   Total Protein:  7.2 (05/01/2008)   Calcium:  9.2 (09/29/2009)   Total Bili:  0.3 (  10/22/2008)   Alk Phos:  65 (10/22/2008)   SGPT (ALT):  29 (10/22/2008)   SGOT (AST):  18 (10/22/2008)   Current Medications (verified): 1)  Lasix 20 Mg Tabs (Furosemide) .... Take 1 By Mouth Qd 2)  Lisinopril-Hydrochlorothiazide 10-12.5 Mg Tabs (Lisinopril-Hydrochlorothiazide) .... Takw 1 By Mouth Qd 3)  Robaxin 500 Mg Tabs (Methocarbamol) .Marland Kitchen.. 1 By Mouth Q8h As Needed Muscle Spasm 4)  Gabapentin 600 Mg Tabs (Gabapentin) .... Take 1/2 For 6 Days Then 1 Once Daily 5)  Bc Fast Pain Relief Arthritis 1000-65 Mg Pack (Aspirin-Caffeine) .... Take As Needed 6)  Metformin Hcl 500 Mg Xr24h-Tab (Metformin Hcl) .Marland Kitchen.. 1 By Mouth Once Daily X 7 Days, Then Increase To 1 Pill By Mouth Two Times A Day (Or As Directed) 7)  Onetouch Ultra Test  Strp (Glucose Blood) .... Use As Directed Two Times A Day 8)  Onetouch Ultrasoft Lancets  Misc (Lancets) .... Use As Directed  Two Times A Day  Allergies (verified): 1)  ! Erythromycin 2)  ! Penicillin 3)  ! * Ginsing 4)  ! * Walnuts 5)  ! * Mushroom 6)  ! * Pineapple  Past History:  Past Medical History: Hypertension Allergic rhinitis Depression High white count - NOS hematuria - NOS  hx multiple substance abuse (alcohol, cocaine, MJ) -clean since 2004 Hepatitis C hx with liver failure - now +ab, -ag OSA  MD roster -  ortho spine - [facility] - [facility] - [facility] - odogwu  Review of Systems  The patient denies chest pain, syncope, peripheral edema, hemoptysis, and abdominal pain.    Physical Exam  General:  alert, well-developed, well-nourished, and cooperative to examination.   tired, mild ill and coughing Eyes:  vision grossly intact; pupils equal, round and reactive to light.  conjunctiva and lids normal.    Ears:  normal pinnae bilaterally, without erythema, swelling, or tenderness to palpation. Hearing grossly normal bilaterally -TMs clear Mouth:  teeth and gums in good repair; mucous membranes moist, without lesions or ulcers. oropharynx clear without exudate, mod erythema.  Lungs:  normal respiratory effort, no intercostal retractions or use of accessory muscles; rhonchil breath sounds bilaterally - no crackles mild exp wheezes.    Heart:  normal rate, regular rhythm, no murmur, and no rub. BLE without edema.    Impression & Recommendations:  Problem # 1:  BRONCHITIS (ICD-490)  Her updated medication list for this problem includes:    Levofloxacin 500 Mg Tabs (Levofloxacin) .Marland Kitchen... 1 by mouth once daily x 7days    Hydromet 5-1.5 Mg/42ml Syrp (Hydrocodone-homatropine) .Marland KitchenMarland KitchenMarland KitchenMarland Kitchen 5 cc by mouth every 4-6 hours as needed for cough  will eval FMLA policy when forms are available - pt to bring these abx and steroids + suppressive cough syrup   Take antibiotics and other medications as directed. Encouraged to push clear liquids, get enough rest, and take acetaminophen  as needed. To be seen in 5-7 days if no improvement, sooner if worse.  Orders: Prescription Created Electronically [id number]) Depo- Medrol 80mg  (J1040) Admin of Therapeutic Inj  intramuscular or subcutaneous ([address])  Complete Medication List: 1)  Lasix 20 Mg Tabs (Furosemide) .... Take 1 by mouth qd 2)  Lisinopril-hydrochlorothiazide 10-12.5 Mg Tabs (Lisinopril-hydrochlorothiazide) .... Takw 1 by mouth qd 3)  Robaxin 500 Mg Tabs (Methocarbamol) .Marland Kitchen.. 1 by mouth q8h as needed muscle spasm 4)  Gabapentin 600 Mg Tabs (Gabapentin) .... Take 1/2 for 6 days then 1 once daily 5)  Bc Fast  Pain Relief Arthritis 1000-65 Mg Pack (Aspirin-caffeine) .... Take as needed 6)  Metformin Hcl 500 Mg Xr24h-tab (Metformin hcl) .Marland Kitchen.. 1 by mouth once daily x 7 days, then increase to 1 pill by mouth two times a day (or as directed) 7)  Onetouch Ultra Test Strp (Glucose blood) .... Use as directed two times a day 8)  Onetouch Ultrasoft Lancets Misc (Lancets) .... Use as directed two times a day 9)  Levofloxacin 500 Mg Tabs (Levofloxacin) .Marland Kitchen.. 1 by mouth once daily x 7days 10)  Hydromet 5-1.5 Mg/41ml Syrp (Hydrocodone-homatropine) .... 5 cc by mouth every 4-6 hours as needed for cough  Patient Instructions: 1)  it was good to see you today. 2)  Levaquin and cough syrup - your antibioitc prescription have been electronically submitted to your pharmacy and the cough syrup prescription given to you. Please take as directed. Contact our office if you believe you're having problems with the medication(s).  3)  also steroid shot (depo medrol) given today for bronchitis symptoms  4)  Get plenty of rest, drink lots of clear liquids, and use Tylenol or Ibuprofen for fever and comfort. Return in 7-10 days if you're not better:sooner if you're feeling worse. 5)  work note for this week given today Prescriptions: HYDROMET 5-1.5 MG/5ML SYRP (HYDROCODONE-HOMATROPINE) 5 cc by mouth every 4-6 hours as needed for cough  #8 oz x 0    Entered and Authorized by:   Newt Lukes MD   Signed by:   Newt Lukes MD on 11/15/2009   Method used:   Print then Give to Patient   RxID:   [id number] LEVOFLOXACIN 500 MG TABS (LEVOFLOXACIN) 1 by mouth once daily x 7days  #7 x 0   Entered and Authorized by:   Newt Lukes MD   Signed by:   Newt Lukes MD on 11/15/2009   Method used:   Electronically to        [facility] [address] (retail)       [phone]       [place], [place]  [address]       Ph: [phone]       Fax: [phone]   RxID:   [id number]     Medication Administration  Injection # 1:    Medication: Depo- Medrol 80mg     Diagnosis: BRONCHITIS (ICD-490)    Route: IM    Site: RUOQ gluteus    Exp Date: 05/2012    Lot #: [id number]    Mfr: [employer]    Patient tolerated injection without complications    Given by: Orlan Leavens RMA (November 15, 2009 11:16 AM)  Orders Added: 1)  Est. Patient Level IV [01093] 2)  Prescription Created Electronically [[id number]] 3)  Depo- Medrol 80mg  [J1040] 4)  Admin of Therapeutic Inj  intramuscular or subcutaneous [23557]

## 2010-03-24 NOTE — Miscellaneous (Signed)
Summary: optimal pressure 12cm   Clinical Lists Changes  Orders: Added new Referral order of DME Referral (DME) - Signed auto shows good compliance, optimal pressure 12cm 

## 2010-03-24 NOTE — Consult Note (Signed)
[facility]   Imported By: Lester [place] 03/29/2009 07:56:32  _____________________________________________________________________  External Attachment:    Type:   Image     Comment:   External Document

## 2010-03-24 NOTE — Letter (Signed)
Summary: Out of Work  [facility]  [address] [place], [place] [address]   Phone: [phone]  Fax: [phone]    March 24, 2009   Employee:  [name]    To Whom It May Concern:   For Medical reasons, please excuse the above named employee from work for the following dates:  Start: 03/23/09    End: 03/28/09, Return to work on 03/29/09 (Monday)    If you need additional information, please feel free to contact our office.         Sincerely,    Dr. Rene Paci

## 2010-03-24 NOTE — Letter (Signed)
Summary: Out of Work  [facility]  [address] [place], [place] [address]   Phone: [phone]  Fax: [phone]    February 26, 2009   Employee:  RANDI POULLARD    To Whom It May Concern:   For Medical reasons, please excuse the above named employee from work for the following dates:  Start: 02/25/09    End:   03/02/09, return to work on Wednesday 03/03/09    If you need additional information, please feel free to contact our office.         Sincerely,    Dr. Rene Paci

## 2010-03-24 NOTE — Letter (Signed)
Summary: Out of Work  [facility]  [address] [place], [place] [address]   Phone: [phone]  Fax: [phone]    September 29, 2009   Employee:  TRESEA HEINE    To Whom It May Concern:   For Medical reasons, please excuse the above named employee from work for the following dates:  Start: 09/27/09    End: 10/03/09, May return to work on 10/04/09    If you need additional information, please feel free to contact our office.         Sincerely,    Dr. Rene Paci

## 2010-03-24 NOTE — Miscellaneous (Signed)
Summary: [facility]  [facility]   Imported By: Lennie Odor 01/21/2010 15:39:26  _____________________________________________________________________  External Attachment:    Type:   Image     Comment:   External Document

## 2010-03-24 NOTE — Assessment & Plan Note (Signed)
Summary: NURSE VISIT WITH LUCY/ [facility]WS  Nurse Visit   Allergies: 1)  ! Erythromycin 2)  ! Penicillin 3)  ! * Ginsing 4)  ! * Walnuts 5)  ! * Mushroom 6)  ! * Pineapple  Orders Added: 1)  Est. Patient Level I [27253]

## 2010-03-24 NOTE — Letter (Signed)
Summary: Out of Work  [facility]  [address] [place], [place] [address]   Phone: [phone]  Fax: [phone]    November 22, 2009   Employee:  GENEVE KIMPEL    To Whom It May Concern:   For Medical reasons, please excuse the above named employee from work for the following dates:  Start:   Monday Sept 26th 2011  End:   Wednesday Oct 5th 2011 - Return Thurs Oct 6th 2011  If you need additional information, please feel free to contact our office.         Sincerely,     Rene Paci MD

## 2010-03-24 NOTE — Progress Notes (Signed)
Summary: Extention  Phone Note Call from Patient Call back at [facility] Phone [phone]   Caller: Patient [phone] Allyne Gee Summary of Call: Pt called requesting work note be extended to Wednesday of this week. Pt says that she needs to be out 5 work day to be considered for medical leave, since pt only works a 4 day week she will need extra days, Please advise. Initial call taken by: Margaret Pyle, CMA,  November 22, 2009 2:37 PM  Follow-up for Phone Call        ok to re-issue work note (done last OV) to be out thru 11/24/09, return 11/25/09 - thanks Follow-up by: Newt Lukes MD,  November 22, 2009 3:19 PM  Additional Follow-up for Phone Call Additional follow up Details #1::        Note generated and pt informed. In cabinet for pt pick up. Additional Follow-up by: Margaret Pyle, CMA,  November 22, 2009 3:49 PM

## 2010-03-24 NOTE — Assessment & Plan Note (Signed)
Summary: SORE THROAT-CHEST CONGEST--STC   Vital Signs:  Patient profile:   40 year old female Height:      64 inches (162.56 cm) Weight:      279.12 pounds (126.87 kg) O2 Sat:      97 % on Room air Temp:     98.3 degrees F (36.83 degrees C) oral Pulse rate:   90 / minute BP sitting:   110 / 74  (left arm) Cuff size:   large  Vitals Entered By: Orlan Leavens (February 26, 2009 9:09 AM)  O2 Flow:  Room air CC: sore throat & chest congestion Is Patient Diabetic? No Pain Assessment Patient in pain? no        Primary Care Provider:  Newt Lukes MD  CC:  sore throat & chest congestion.  History of Present Illness: here today with complaint of chest congestion. onset of symptoms was 7 days ago. course has been gradual onset and now occurs in progressive pattern - problem precipitated by +exposure to sick contacts at home symptom characterized as chest rattel and congestion, traveled up to head now causing sinus congestion problem associated with +thick yellow sputum from cough and nasal d/c but not associated with fever, headache or chest pain. symptoms worsened with night air and lying flat - also worse using CPAP and Mucinex  also c/o freq migraines - but better than previous months -  ?re occ vicodin use - (rx for narcs was denied here)   Current Medications (verified): 1)  Lasix 20 Mg Tabs (Furosemide) .... Take 1 By Mouth Qd 2)  Lisinopril-Hydrochlorothiazide 10-12.5 Mg Tabs (Lisinopril-Hydrochlorothiazide) .... Takw 1 By Mouth Qd 3)  Flexeril 10 Mg Tabs (Cyclobenzaprine Hcl) .... Take 1 By Mouth Once Daily Prn 4)  Robaxin 500 Mg Tabs (Methocarbamol) .Marland Kitchen.. 1 By Mouth Q8h As Needed Muscle Spasm 5)  Ultram 50 Mg Tabs (Tramadol Hcl) .... Take 1-2 Qd  As Needed  Allergies (verified): 1)  ! Erythromycin 2)  ! Penicillin 3)  ! * Ginsing 4)  ! * Walnuts 5)  ! * Mushroom 6)  ! * Pineapple  Past History:  Past Medical History: Hypertension Allergic  rhinitis Depression High white count - NOS - odogwu hematuria - NOS hx multiple substance abuse (alcohol, cocaine, MJ) -clean since 2004 Hepatitis C hx with liver failure - now +ab, -ag OSA  MD rooster -  ortho spine - [facility] - [facility]  Review of Systems  The patient denies anorexia, chest pain, syncope, and abdominal pain.    Physical Exam  General:  alert, well-developed, well-nourished, and cooperative to examination.    Eyes:  vision grossly intact; pupils equal, round and reactive to light.  conjunctiva and lids normal.    Ears:  normal pinnae bilaterally, without erythema, swelling, or tenderness to palpation. Hearing grossly normal bilaterally  Mouth:  teeth and gums in good repair; mucous membranes moist, without lesions or ulcers. oropharynx clear without exudate, mod erythema.  Lungs:  normal respiratory effort, no intercostal retractions or use of accessory muscles; bilateral rhonchi and congestion but no crackles and no wheezes.    Heart:  normal rate, regular rhythm, no murmur, and no rub. BLE without edema.    Impression & Recommendations:  Problem # 1:  ACUTE BRONCHITIS (ICD-466.0) work excuse provided -  abx, symptoms tx discussed Her updated medication list for this problem includes:    Azithromycin 250 Mg Tabs (Azithromycin) .Marland Kitchen... 2 tabs by mouth today, then 1 by mouth daily  starting tomorrow  Take antibiotics and other medications as directed. Encouraged to push clear liquids, get enough rest, and take acetaminophen as needed. To be seen in 5-7 days if no improvement, sooner if worse.  Orders: Prescription Created Electronically [phone])  Problem # 2:  MIGRAINE, UNSPEC., W/O INTRACTABLE MIGRAINE (ICD-346.90) no narcs from this office for chronic problems - refer to [facility] - pt expresses understanding The following medications were removed from the medication list:    Hydrocodone-acetaminophen 5-325 Mg Tabs (Hydrocodone-acetaminophen) .Marland Kitchen... Take 1 q 4  hours prn Her updated medication list for this problem includes:    Ultram 50 Mg Tabs (Tramadol hcl) .Marland Kitchen... Take 1-2 qd  as needed  Orders: Headache Clinic Referral (Headache)  Complete Medication List: 1)  Lasix 20 Mg Tabs (Furosemide) .... Take 1 by mouth qd 2)  Lisinopril-hydrochlorothiazide 10-12.5 Mg Tabs (Lisinopril-hydrochlorothiazide) .... Takw 1 by mouth qd 3)  Flexeril 10 Mg Tabs (Cyclobenzaprine hcl) .... Take 1 by mouth once daily prn 4)  Robaxin 500 Mg Tabs (Methocarbamol) .Marland Kitchen.. 1 by mouth q8h as needed muscle spasm 5)  Ultram 50 Mg Tabs (Tramadol hcl) .... Take 1-2 qd  as needed 6)  Azithromycin 250 Mg Tabs (Azithromycin) .... 2 tabs by mouth today, then 1 by mouth daily starting tomorrow  Patient Instructions: 1)  it was good to see you today.  2)  antibiotics - Zpack - for bronchitis - your prescription has been electronically submitted to your pharmacy. Please take as directed. Contact our office if you believe you're having problems with the medication(s).  3)  work note provided as requested 4)  refills on Lasix and lisinopril-hctz also electronically submitted to [facility] 5)  Acute bronchitis symptoms: may also take over the counter cough medications and Tylenol as needed. call if no improvement in  5-7 days, sooner if increasing cough, fever, or new symptoms (shortness of breath, chest pain). 6)  we'll make referral to [facility] for your migraines. Our office will contact you regarding this appointment once made.  Prescriptions: LASIX 20 MG TABS (FUROSEMIDE) take 1 by mouth qd  #30 x 11   Entered and Authorized by:   Newt Lukes MD   Signed by:   Newt Lukes MD on 02/26/2009   Method used:   Electronically to        [facility] [address] (retail)       [phone]       [place], [place]  [address]       Ph: [phone]       Fax: [phone]   RxID:   [id number] LISINOPRIL-HYDROCHLOROTHIAZIDE  10-12.5 MG TABS (LISINOPRIL-HYDROCHLOROTHIAZIDE) takw 1 by mouth qd  #30 x 11   Entered and Authorized by:   Newt Lukes MD   Signed by:   Newt Lukes MD on 02/26/2009   Method used:   Electronically to        [facility] [address] (retail)       [phone]       [place], [place]  [address]       Ph: [phone]       Fax: [phone]   RxID:   [id number] AZITHROMYCIN 250 MG TABS (AZITHROMYCIN) 2 tabs by mouth today, then 1 by mouth daily starting tomorrow  #6 x 0   Entered and Authorized by:   Newt Lukes  MD   Signed by:   Newt Lukes MD on 02/26/2009   Method used:   Electronically to        [facility] [address] (retail)       [phone]       [place], [place]  [address]       Ph: [phone]       Fax: [phone]   RxID:   [id number]

## 2010-04-14 ENCOUNTER — Ambulatory Visit (INDEPENDENT_AMBULATORY_CARE_PROVIDER_SITE_OTHER): Payer: PRIVATE HEALTH INSURANCE | Admitting: Internal Medicine

## 2010-04-14 ENCOUNTER — Other Ambulatory Visit: Payer: Self-pay | Admitting: Internal Medicine

## 2010-04-14 ENCOUNTER — Other Ambulatory Visit: Payer: PRIVATE HEALTH INSURANCE

## 2010-04-14 ENCOUNTER — Encounter: Payer: Self-pay | Admitting: Internal Medicine

## 2010-04-14 DIAGNOSIS — E119 Type 2 diabetes mellitus without complications: Secondary | ICD-10-CM

## 2010-04-14 DIAGNOSIS — E871 Hypo-osmolality and hyponatremia: Secondary | ICD-10-CM

## 2010-04-14 DIAGNOSIS — F329 Major depressive disorder, single episode, unspecified: Secondary | ICD-10-CM

## 2010-04-14 DIAGNOSIS — D72829 Elevated white blood cell count, unspecified: Secondary | ICD-10-CM

## 2010-04-14 LAB — CBC WITH DIFFERENTIAL/PLATELET
Basophils Absolute: 0.1 10*3/uL (ref 0.0–0.1)
Eosinophils Absolute: 0.6 10*3/uL (ref 0.0–0.7)
Eosinophils Relative: 3.8 % (ref 0.0–5.0)
HCT: 43.3 % (ref 36.0–46.0)
Lymphs Abs: 4.3 10*3/uL — ABNORMAL HIGH (ref 0.7–4.0)
MCHC: 34.7 g/dL (ref 30.0–36.0)
MCV: 94.3 fl (ref 78.0–100.0)
Monocytes Absolute: 0.9 10*3/uL (ref 0.1–1.0)
Neutrophils Relative %: 62.6 % (ref 43.0–77.0)
Platelets: 380 10*3/uL (ref 150.0–400.0)
RDW: 13.7 % (ref 11.5–14.6)

## 2010-04-14 LAB — LIPID PANEL
HDL: 30.3 mg/dL — ABNORMAL LOW (ref >39.00)
Total CHOL/HDL Ratio: 5
Triglycerides: 120 mg/dL (ref 0.0–149.0)

## 2010-04-14 LAB — BASIC METABOLIC PANEL
Calcium: 9.1 mg/dL (ref 8.4–10.5)
GFR: 68.72 mL/min (ref >60.00)
Glucose, Bld: 166 mg/dL — ABNORMAL HIGH (ref 70–99)
Potassium: 4.1 mEq/L (ref 3.5–5.1)
Sodium: 138 mEq/L (ref 135–145)

## 2010-04-14 LAB — TSH: TSH: 0.43 u[IU]/mL (ref 0.35–5.50)

## 2010-04-19 NOTE — Assessment & Plan Note (Signed)
Summary: FU ON MEDS /NWS   Vital Signs:  Patient profile:   40 year old female Height:      64 inches (162.56 cm) Weight:      259.0 pounds (117.73 kg) O2 Sat:      95 % on Room air Temp:     98.4 degrees F (36.89 degrees C) oral Pulse rate:   83 / minute BP sitting:   112 / 72  (left arm) Cuff size:   large  Vitals Entered By: Orlan Leavens RMA (April 14, 2010 9:08 AM)  O2 Flow:  Room air CC: F/U on Meds Is Patient Diabetic? Yes Did you bring your meter with you today? No Pain Assessment Patient in pain? no        Primary Care Provider:  Newt Lukes MD  CC:  F/U on Meds.  History of Present Illness: here for f/u  DM2 - started on meds for same 09/2009- checks sugars <1x/day - no signs or symptoms hypo or hyperglycemia - reports compliance with ongoing medical treatment and no changes in medication dose or frequency. denies adverse side effects related to current therapy.   bipolar hx -  well controlled but states she also uses FMLA days for times when bipolar "not so good" no recent med changes - but increase depression with recent stressors - school, relationship  migraines - fewer episodes since being on CPAP for OSA will use FMLA time during bad days increase and recent flare related to stress:  LBP - currently symptoms increased due to spasm due to stress reviewed visit with dr. Alveda Reasons -  pt understands weight is contributing to strain on back and will continue to work on weight loss  leukocytosis - still no primary dx - lats seen by heme 11/2008  hyponatremia -  pt unsure of exact dx until now "but i know they told me not to drink water so much" long hx same - avoids salt use in diet because of HTN hx  Preventive Screening-Counseling & Management  Alcohol-Tobacco     Alcohol drinks/day: 0     Alcohol Counseling: not indicated; patient does not drink     Smoking Status: quit     Tobacco Counseling: not to resume use of tobacco  products  Clinical Review Panels:  Prevention   Last Pap Smear:  Done. (09/21/2002)  Lipid Management   Cholesterol:  131 (05/01/2008)   LDL (bad choesterol):  23 (05/01/2008)   HDL (good cholesterol):  28 (05/01/2008)   Triglycerides:  117 (05/01/2008)  Diabetes Management   HgBA1C:  10.2 (09/29/2009)   Creatinine:  0.8 (09/29/2009)  CBC   WBC:  14.0 (12/07/2008)   RBC:  4.89 (12/07/2008)   Hgb:  15.8 (12/07/2008)   Hct:  46.3 (12/07/2008)   Platelets:  310.0 (12/07/2008)   MCV  94.8 (12/07/2008)   MCHC  34.0 (12/07/2008)   RDW  12.7 (12/07/2008)   PMN:  59.7 (12/07/2008)   Lymphs:  29.7 (12/07/2008)   Monos:  6.7 (12/07/2008)   Eosinophils:  3.2 (12/07/2008)   Basophil:  0.7 (12/07/2008)  Complete Metabolic Panel   Glucose:  313 (09/29/2009)   Sodium:  136 (09/29/2009)   Potassium:  4.5 (09/29/2009)   Chloride:  99 (09/29/2009)   CO2:  28 (09/29/2009)   BUN:  11 (09/29/2009)   Creatinine:  0.8 (09/29/2009)   Albumin:  3.8 (05/01/2008)   Total Protein:  7.2 (05/01/2008)   Calcium:  9.2 (09/29/2009)   Total Bili:  0.3 (10/22/2008)   Alk Phos:  65 (10/22/2008)   SGPT (ALT):  29 (10/22/2008)   SGOT (AST):  18 (10/22/2008)   Current Medications (verified): 1)  Lasix 20 Mg Tabs (Furosemide) .... Take 1 By Mouth Qd 2)  Lisinopril-Hydrochlorothiazide 10-12.5 Mg Tabs (Lisinopril-Hydrochlorothiazide) .... Takw 1 By Mouth Qd 3)  Robaxin 500 Mg Tabs (Methocarbamol) .Marland Kitchen.. 1 By Mouth Q8h As Needed Muscle Spasm 4)  Gabapentin 600 Mg Tabs (Gabapentin) .... Take 1/2 For 6 Days Then 1 Once Daily 5)  Bc Fast Pain Relief Arthritis 1000-65 Mg Pack (Aspirin-Caffeine) .... Take As Needed 6)  Metformin Hcl 500 Mg Xr24h-Tab (Metformin Hcl) .Marland Kitchen.. 1 By Mouth Once Daily X 7 Days, Then Increase To 1 Pill By Mouth Two Times A Day (Or As Directed) 7)  Onetouch Ultra Test  Strp (Glucose Blood) .... Use As Directed Two Times A Day 8)  Onetouch Ultrasoft Lancets  Misc (Lancets) .... Use As  Directed Two Times A Day  Allergies (verified): 1)  ! Erythromycin 2)  ! Penicillin 3)  ! * Ginsing 4)  ! * Walnuts 5)  ! * Mushroom 6)  ! * Pineapple  Past History:  Past Medical History: Hypertension Allergic rhinitis Depression High white count - NOS hematuria - NOS  hx multiple substance abuse (alcohol, cocaine, MJ) -clean since 2004 Hepatitis C hx with liver failure - now +ab, -ag OSA DM2  MD roster -  ortho spine - [facility] - [facility] - [facility] - [facility]  Review of Systems  The patient denies anorexia, weight gain, chest pain, syncope, and headaches.    Physical Exam  General:  alert, well-developed, well-nourished, and cooperative to examination.   Lungs:  normal respiratory effort, no intercostal retractions or use of accessory muscles; normal breath sounds bilaterally - no crackles and no wheezes.    Heart:  normal rate, regular rhythm, no murmur, and no rub. BLE without edema. Psych:  Oriented X3, memory intact for recent and remote, normally interactive, good eye contact, mildly anxious appearing, not depressed appearing, and not agitated.      Impression & Recommendations:  Problem # 1:  DIABETES MELLITUS, TYPE II (ICD-250.00) started med 09/2009 - reports dm center showed a1c 6.3 -  recheck now - titrate as needed - weight loss reviewed Her updated medication list for this problem includes:    Lisinopril-hydrochlorothiazide 10-12.5 Mg Tabs (Lisinopril-hydrochlorothiazide) .Marland Kitchen... Takw 1 by mouth qd    Metformin Hcl 500 Mg Xr24h-tab (Metformin hcl) .Marland Kitchen... 1 by mouth two times a day  Orders: TLB-A1C / Hgb A1C (Glycohemoglobin) (83036-A1C) TLB-BMP (Basic Metabolic Panel-BMET) (80048-METABOL) TLB-Lipid Panel (80061-LIPID)  Labs Reviewed: Creat: 0.8 (09/29/2009)    Reviewed HgBA1c results: 10.2 (09/29/2009)  Problem # 2:  DEPRESSION (ICD-311)  Orders: TLB-CBC Platelet - w/Differential (85025-CBCD) TLB-TSH (Thyroid  Stimulating Hormone) (84443-TSH)  a/w bipolar symptoms - currently reports exac sue to stressors cont pysc care as ongoing - no rec med tx at this time  Problem # 3:  HYPONATREMIA, CHRONIC (ICD-276.1)  Orders: TLB-BMP (Basic Metabolic Panel-BMET) (80048-METABOL)  on diuretics - ?if related to other underlying med issues - pulm or other  ok to add NaCl to diet use continue free water restriction but can have some water with meds/ meals recheck Bmet now and TSH to monitor  Problem # 4:  LEUKOCYTOSIS UNSPECIFIED (ICD-288.60) seen by heme 11/2008 - monitor as needed  Orders: TLB-CBC Platelet - w/Differential (85025-CBCD)  Complete Medication List: 1)  Lasix 20 Mg Tabs (Furosemide) .... Take 1 by mouth qd 2)  Lisinopril-hydrochlorothiazide 10-12.5 Mg Tabs (Lisinopril-hydrochlorothiazide) .... Takw 1 by mouth qd 3)  Robaxin-750 750 Mg Tabs (Methocarbamol) .Marland Kitchen.. 1 by mouth every 8 hpurs as needed for muscle spasm 4)  Gabapentin 600 Mg Tabs (Gabapentin) .Marland Kitchen.. 1 by mouth once daily 5)  Bc Fast Pain Relief Arthritis 1000-65 Mg Pack (Aspirin-caffeine) .... Take as needed 6)  Metformin Hcl 500 Mg Xr24h-tab (Metformin hcl) .Marland Kitchen.. 1 by mouth two times a day 7)  Onetouch Ultra Test Strp (Glucose blood) .... Use as directed two times a day 8)  Onetouch Ultrasoft Lancets Misc (Lancets) .... Use as directed two times a day 9)  Flexeril 10 Mg Tabs (Cyclobenzaprine hcl) .Marland Kitchen.. 1 by mouth every 8 hours as needed for muscle spasm  Patient Instructions: 1)  it was good to see you today.  2)  test(s) ordered today - your results will be posted on the phone tree for review in 48-72 hours from the time of test completion; call [phone] and enter your 9 digit MRN (listed above on this page, just below your name); if any changes need to be made or there are abnormal results, you will be contacted directly.  3)  increase dose of robaxin and add flexeril to use alternatively for treatment of muscle spasm - your  prescriptions have been electronically submitted to your pharmacy. Please take as directed. Contact our office if you believe you're having problems with the medication(s).  4)  Please schedule a follow-up appointment in 3-4 months to review diabetes and other issues, call sooner if problems.  Prescriptions: FLEXERIL 10 MG TABS (CYCLOBENZAPRINE HCL) 1 by mouth every 8 hours as needed for muscle spasm  #40 x 0   Entered and Authorized by:   Newt Lukes MD   Signed by:   Newt Lukes MD on 04/14/2010   Method used:   Electronically to        [facility] [address] (retail)       [phone]       [place], [place]  [address]       Ph: [phone]       Fax: [phone]   RxID:   [id number] ROBAXIN-750 750 MG TABS (METHOCARBAMOL) 1 by mouth every 8 hpurs as needed for muscle spasm  #40 x 0   Entered and Authorized by:   Newt Lukes MD   Signed by:   Newt Lukes MD on 04/14/2010   Method used:   Electronically to        [facility] [address] (retail)       [phone]       [place], [place]  [address]       Ph: [phone]       Fax: [phone]   RxID:   [id number] METFORMIN HCL 500 MG XR24H-TAB (METFORMIN HCL) 1 by mouth two times a day  #60 x 6   Entered and Authorized by:   Newt Lukes MD   Signed by:   Newt Lukes MD on 04/14/2010   Method used:   Electronically to        [facility] [address] (retail)       [phone]       [place], [place]  [address]  Ph: [phone]       Fax: [phone]   RxID:   [id number]    Orders Added: 1)  Est. Patient Level IV [01027] 2)  TLB-A1C / Hgb A1C (Glycohemoglobin) [83036-A1C] 3)  TLB-CBC Platelet - w/Differential [85025-CBCD] 4)  TLB-BMP (Basic Metabolic Panel-BMET) [80048-METABOL] 5)  TLB-TSH (Thyroid Stimulating Hormone) [84443-TSH] 6)  TLB-Lipid Panel  [80061-LIPID]

## 2010-04-26 ENCOUNTER — Other Ambulatory Visit: Payer: Self-pay | Admitting: Hematology and Oncology

## 2010-04-26 ENCOUNTER — Encounter (HOSPITAL_BASED_OUTPATIENT_CLINIC_OR_DEPARTMENT_OTHER): Payer: PRIVATE HEALTH INSURANCE | Admitting: Hematology and Oncology

## 2010-04-26 DIAGNOSIS — D72829 Elevated white blood cell count, unspecified: Secondary | ICD-10-CM

## 2010-04-26 DIAGNOSIS — K754 Autoimmune hepatitis: Secondary | ICD-10-CM

## 2010-04-26 LAB — CBC WITH DIFFERENTIAL/PLATELET
Basophils Absolute: 0.1 10*3/uL (ref 0.0–0.1)
HCT: 43.7 % (ref 34.8–46.6)
HGB: 15.1 g/dL (ref 11.6–15.9)
LYMPH%: 27 % (ref 14.0–49.7)
MCH: 31.9 pg (ref 25.1–34.0)
MONO#: 1 10*3/uL — ABNORMAL HIGH (ref 0.1–0.9)
NEUT%: 63.4 % (ref 38.4–76.8)
Platelets: 340 10*3/uL (ref 145–400)
WBC: 17.7 10*3/uL — ABNORMAL HIGH (ref 3.9–10.3)
lymph#: 4.8 10*3/uL — ABNORMAL HIGH (ref 0.9–3.3)

## 2010-04-26 LAB — BASIC METABOLIC PANEL
BUN: 15 mg/dL (ref 6–23)
CO2: 21 mEq/L (ref 19–32)
Calcium: 9.4 mg/dL (ref 8.4–10.5)
Chloride: 104 mEq/L (ref 96–112)
Creatinine, Ser: 0.82 mg/dL (ref 0.40–1.20)

## 2010-05-27 LAB — DIFFERENTIAL
Basophils Absolute: 0.1 10*3/uL (ref 0.0–0.1)
Basophils Relative: 1 % (ref 0–1)
Eosinophils Absolute: 0.3 10*3/uL (ref 0.0–0.7)
Eosinophils Relative: 1 % (ref 0–5)
Monocytes Absolute: 1.4 10*3/uL — ABNORMAL HIGH (ref 0.1–1.0)
Neutro Abs: 12.3 10*3/uL — ABNORMAL HIGH (ref 1.7–7.7)

## 2010-05-27 LAB — URINE CULTURE: Colony Count: 50000

## 2010-05-27 LAB — URINALYSIS, ROUTINE W REFLEX MICROSCOPIC
Bilirubin Urine: NEGATIVE
Glucose, UA: NEGATIVE mg/dL
Hgb urine dipstick: NEGATIVE
Ketones, ur: NEGATIVE mg/dL
Protein, ur: NEGATIVE mg/dL

## 2010-05-27 LAB — COMPREHENSIVE METABOLIC PANEL
Albumin: 3.5 g/dL (ref 3.5–5.2)
Alkaline Phosphatase: 64 U/L (ref 39–117)
BUN: 20 mg/dL (ref 6–23)
Chloride: 99 mEq/L (ref 96–112)
Potassium: 3.6 mEq/L (ref 3.5–5.1)
Total Bilirubin: 0.4 mg/dL (ref 0.3–1.2)

## 2010-05-27 LAB — POCT PREGNANCY, URINE: Preg Test, Ur: NEGATIVE

## 2010-05-27 LAB — CBC
HCT: 44.2 % (ref 36.0–46.0)
Hemoglobin: 15.3 g/dL — ABNORMAL HIGH (ref 12.0–15.0)
Platelets: 317 10*3/uL (ref 150–400)
WBC: 19.2 10*3/uL — ABNORMAL HIGH (ref 4.0–10.5)

## 2010-06-24 ENCOUNTER — Other Ambulatory Visit (INDEPENDENT_AMBULATORY_CARE_PROVIDER_SITE_OTHER): Payer: PRIVATE HEALTH INSURANCE | Admitting: Internal Medicine

## 2010-06-24 ENCOUNTER — Other Ambulatory Visit (HOSPITAL_COMMUNITY)
Admission: RE | Admit: 2010-06-24 | Discharge: 2010-06-24 | Disposition: A | Discharge status: Home / Self Care | Payer: PRIVATE HEALTH INSURANCE | Source: Ambulatory Visit | Attending: Internal Medicine | Admitting: Internal Medicine

## 2010-06-24 ENCOUNTER — Other Ambulatory Visit: Payer: PRIVATE HEALTH INSURANCE

## 2010-06-24 ENCOUNTER — Ambulatory Visit (INDEPENDENT_AMBULATORY_CARE_PROVIDER_SITE_OTHER): Payer: PRIVATE HEALTH INSURANCE | Admitting: Internal Medicine

## 2010-06-24 ENCOUNTER — Encounter: Payer: Self-pay | Admitting: Internal Medicine

## 2010-06-24 DIAGNOSIS — N898 Other specified noninflammatory disorders of vagina: Secondary | ICD-10-CM

## 2010-06-24 DIAGNOSIS — J039 Acute tonsillitis, unspecified: Secondary | ICD-10-CM

## 2010-06-24 DIAGNOSIS — Z113 Encounter for screening for infections with a predominantly sexual mode of transmission: Secondary | ICD-10-CM | POA: Insufficient documentation

## 2010-06-24 DIAGNOSIS — Z01419 Encounter for gynecological examination (general) (routine) without abnormal findings: Secondary | ICD-10-CM | POA: Insufficient documentation

## 2010-06-24 MED ORDER — AZITHROMYCIN 500 MG PO TABS: 500.0000 mg | ORAL_TABLET | Freq: Every day | ORAL | Status: AC

## 2010-06-24 MED ORDER — FLUCONAZOLE 150 MG PO TABS: 150.0000 mg | ORAL_TABLET | Freq: Once | ORAL | Status: AC

## 2010-06-24 NOTE — Patient Instructions (Signed)
Vaginitis Vaginitis in a soreness, swelling and redness (inflammation) of the vagina and vulva. This is not a sexually transmitted infection.   CAUSES Yeast vaginitis is caused by yeast (candida) that is normally found in your vagina. With a yeast infection, the candida has over grown in number to a point that upsets the chemical balance. SYMPTOMS   White thick vaginal discharge.   Swelling, itching, redness and irritation of the vagina and possibly the lips of the vagina (vulva).   Burning or painful urination.   Painful intercourse.  HOME CARE INSTRUCTIONS  Finish all medication as prescribed.   Do not have sex until treatment is completed or instructed by your healthcare giver.   Take warm sitz baths.   Do not douche.   Do not use tampons, especially scented ones.   Wear cotton underwear.   Avoid tight pants and panty hose.   Tell your sexual partner that you have a yeast infection. They should go to their caregiver if they have symptoms such as mild rash or itching.   Your sexual partner should be treated if your infection is difficult to eliminate.   Practice safer sex. Use condoms.   Some vaginal medications cause latex condoms to fail. Ask your caregiver this.  SEEK MEDICAL CARE IF:  You develop a fever.   The infection is getting worse after 2 days of treatment.   The infection is not getting better after 3 days of treatment.   You develop blisters in or around your vagina.   You develop vaginal bleeding, and it is not your menstrual period.   You have pain when you urinate.   You develop intestinal problems.   You have pain with sexual intercourse.  Document Released: 03/16/2004 Document Re-Released: 12/04/2008 [facility] Patient Information 2011 [place], [place].Tonsillitis Tonsils are lumps of lymphoid tissues at the back of the throat. They help fight nose and throat infections and keep infection from spreading to other parts of the body. Tonsillitis  is an infection of the throat that causes the tonsils to become red, tender, and swollen. If attacks of tonsillitis are severe and frequent, your caregiver may recommend surgery to remove the tonsils (tonsillectomy). Many sore throats are viral. Cultures are often taken to determine the need for medications that fight infections. HOME CARE INSTRUCTIONS  You or your child should rest as much as possible and get plenty of sleep.   Encourage drinking plenty of fluids. While the throat is very sore, eat or feed your child soft foods or liquids, such as milk, shakes, ice cream, soups, or instant breakfast drinks.   Children may suck on frozen ice bars.   Older children and adults may gargle with a warm or cold liquid to help soothe the throat. For gargling, use 1 teaspoon of salt mixed in 1 cup of water.   Family members who develop a sore throat or fever should have a medical exam or throat culture.   Only take over-the-counter or prescription medicines for pain, discomfort, or fever as directed by your caregiver.  SEEK MEDICAL CARE IF:  You or your child has an oral temperature above 102 F (38.9 C).   Your baby is older than 3 months with a rectal temperature of 100.5 F (38.1 C) or higher for more than 1 day.   Large, tender lumps in the neck develop.   A rash develops.   Green, yellow-brown, or bloody sputum is coughed up.   You were instructed to call for culture results.  SEEK IMMEDIATE MEDICAL CARE IF:  You or your child develops any new symptoms such as vomiting, severe headache, stiff neck, chest pain, or trouble breathing or swallowing.   You or your child has severe throat pain along with drooling or voice changes.   You or your child has severe pain, unrelieved with recommended medications.   You or your child is unable to fully open the mouth.   You or your child develops redness, swelling, or severe pain anywhere on the neck.   You or your child has an oral  temperature above 102 F (38.9 C), not controlled by medicine.   Your baby is older than 3 months with a rectal temperature of 102 F (38.9 C) or higher.   Your baby is 71 months old or younger with a rectal temperature of 100.4 F (38 C) or higher.  MAKE SURE YOU:  Understand these instructions.   Will watch your condition.   Will get help right away if you are not doing well or get worse.  Document Released: 11/16/2004 Document Re-Released: 07/27/2009 [facility] Patient Information 2011 [place], [place].

## 2010-06-24 NOTE — Assessment & Plan Note (Signed)
She multiple s/s that are c/w strep so I have asked her to start zap which she tells me that she has taken before with no problems

## 2010-06-24 NOTE — Progress Notes (Signed)
Subjective:    Patient ID: [name], female    DOB: Aug 12, 1970, 40 y.o.   MRN: [id number]  Sore Throat  This is a new problem. The current episode started in the past 7 days. The problem has been gradually worsening. Neither side of throat is experiencing more pain than the other. The maximum temperature recorded prior to her arrival was 101 - 101.9 F. The fever has been present for 1 to 2 days. The pain is at a severity of 2/10. Associated symptoms include swollen glands. Pertinent negatives include no abdominal pain, congestion, coughing, diarrhea, drooling, ear discharge, ear pain, headaches, hoarse voice, plugged ear sensation, neck pain, shortness of breath, stridor, trouble swallowing or vomiting. She has had exposure to strep. She has had no exposure to mono. She has tried acetaminophen for the symptoms. The treatment provided no relief.  Vaginal Discharge The patient's primary symptoms include genital itching, a genital odor and a vaginal discharge (and itching). The patient's pertinent negatives include no genital lesions, genital rash, missed menses, pelvic pain or vaginal bleeding. This is a new problem. The current episode started in the past 7 days. The problem occurs intermittently. The problem has been gradually worsening. The patient is experiencing no pain. The problem affects both sides. She is not pregnant. Pertinent negatives include no abdominal pain, anorexia, back pain, chills, constipation, diarrhea, discolored urine, dysuria, fever, flank pain, frequency, headaches, hematuria, joint pain, joint swelling, nausea, painful intercourse, rash, sore throat, urgency or vomiting.      Review of Systems  Constitutional: Negative for fever, chills, diaphoresis, activity change, appetite change, fatigue and unexpected weight change.  HENT: Negative for ear pain, congestion, sore throat, hoarse voice, facial swelling, drooling, trouble swallowing, neck pain, neck stiffness and ear  discharge.   Respiratory: Negative for apnea, cough, choking, chest tightness, shortness of breath and stridor.   Cardiovascular: Negative for chest pain, palpitations and leg swelling.  Gastrointestinal: Negative for nausea, vomiting, abdominal pain, diarrhea, constipation and anorexia.  Genitourinary: Positive for vaginal discharge (and itching). Negative for dysuria, urgency, frequency, hematuria, flank pain, decreased urine volume, vaginal bleeding, enuresis, difficulty urinating, genital sores, vaginal pain, pelvic pain, dyspareunia and missed menses.  Musculoskeletal: Negative for myalgias, back pain, joint pain, joint swelling, arthralgias and gait problem.  Skin: Negative for color change, pallor and rash.  Neurological: Negative for dizziness, tremors, seizures, syncope, facial asymmetry, speech difficulty, weakness, light-headedness, numbness and headaches.  Hematological: Positive for adenopathy. Does not bruise/bleed easily.       Objective:   Physical Exam  Vitals reviewed. Constitutional: She appears well-developed and well-nourished. No distress.  HENT:  Head: Normocephalic and atraumatic. No trismus in the jaw.  Right Ear: External ear normal.  Left Ear: External ear normal.  Nose: Nose normal.  Mouth/Throat: Uvula is midline and mucous membranes are normal. Mucous membranes are not pale, not dry and not cyanotic. No uvula swelling. Oropharyngeal exudate and posterior oropharyngeal erythema present. No posterior oropharyngeal edema or tonsillar abscesses.  Eyes: Conjunctivae and EOM are normal. Pupils are equal, round, and reactive to light. Right eye exhibits no discharge. Left eye exhibits no discharge. No scleral icterus.  Neck: Normal range of motion. Neck supple. No JVD present. No tracheal deviation present. No thyromegaly present.  Cardiovascular: Normal rate, regular rhythm and intact distal pulses.  Exam reveals no gallop and no friction rub.   No murmur  heard. Pulmonary/Chest: Effort normal and breath sounds normal. No stridor. No respiratory distress. She has no wheezes. She  has no rales. She exhibits no tenderness.  Abdominal: Soft. Bowel sounds are normal. She exhibits no distension and no mass. There is no tenderness. There is no rebound and no guarding. Hernia confirmed negative in the right inguinal area and confirmed negative in the left inguinal area.  Genitourinary: Rectum normal. Guaiac negative stool. No breast swelling, tenderness, discharge or bleeding. No labial fusion. There is no rash, tenderness, lesion or injury on the right labia. There is no rash, tenderness, lesion or injury on the left labia. Cervix exhibits discharge. Cervix exhibits no motion tenderness and no friability. Right adnexum displays no mass, no tenderness and no fullness. Left adnexum displays no mass, no tenderness and no fullness. There is erythema around the vagina. No tenderness or bleeding around the vagina. No foreign body around the vagina. No signs of injury around the vagina. Vaginal discharge (very scant, yellow, foul-smelling) found.  Lymphadenopathy:    She has cervical adenopathy.       Right: No inguinal adenopathy present.       Left: No inguinal adenopathy present.  Skin: She is not diaphoretic.        Lab Results  Component Value Date   WBC 15.6* 04/14/2010   HGB 15.1 04/26/2010   HCT 43.7 04/26/2010   PLT 340 04/26/2010   CHOL 138 04/14/2010   TRIG 120.0 04/14/2010   HDL 30.30* 04/14/2010   ALT 32 10/29/2008   AST 26 10/29/2008   NA 137 04/26/2010   K 4.1 04/26/2010   CL 104 04/26/2010   CREATININE 0.82 04/26/2010   BUN 15 04/26/2010   CO2 21 04/26/2010   TSH 0.43 04/14/2010   HGBA1C 7.3* 04/14/2010    Assessment & Plan:

## 2010-06-24 NOTE — Assessment & Plan Note (Addendum)
PAP with GC/Chlam?wet prep was done, I will check the results and treat if needed  Wet prep is + for yeast so Diflucan was prescribed

## 2010-06-28 ENCOUNTER — Encounter: Payer: Self-pay | Admitting: Internal Medicine

## 2010-07-07 ENCOUNTER — Encounter: Payer: Self-pay | Admitting: Internal Medicine

## 2010-07-08 NOTE — H&P (Signed)
[name], [name]              ACCOUNT NO.:  [id number]   MEDICAL RECORD NO.:  [id number]          PATIENT TYPE:  AMB   LOCATION:  [facility]                           FACILITY:  [facility]   PHYSICIAN:  Almedia Balls. Fore, M.D.   DATE OF BIRTH:  [birth year]   DATE OF ADMISSION:  08/03/2005  DATE OF DISCHARGE:                                HISTORY & PHYSICAL   HISTORY:  The patient is a 40 year old gravida 6, para 1 with three  spontaneous ABs and two therapeutic ABs whose last menstrual period is noted  to be in November 2006.  She states that since that time she has had  continual bleeding and spotting.  She was seen by another physician in town  for this problem and had D&C with findings of benign secretory endometrium.  She has continued to bleed moderately heavily at times and heavy at other  times such that her hemoglobin has been as low as 10.3.  She has been on  iron therapy for the past 6 weeks and most recent hemoglobin was reportedly  12.8.  She also has had abdominal pain and underwent  esophagogastroduodenoscopy with findings of chronic active gastritis with  Helicobacter pylori cultured.  Colonoscopy was negative.  Pap smear has been  within normal limits within the past 6 months.  She was found on ultrasound  to have large fibroids which are felt to be the major factors for her  bleeding.  She is admitted at this time for abdominal hysterectomy, possible  bilateral salpingo-oophorectomy.  She has been fully counseled as to the  nature of the procedure and the risks involved to include risks of  anesthesia, injury to bowel, bladder, blood vessels, ureters, postoperative  hemorrhage, infection, recuperation, use of hormone replacement should her  ovaries be removed.   PAST MEDICAL HISTORY:  Past medical history includes a liver biopsy in 1999  for what was described as autoimmune hepatitis.  She has had negative  hepatitis C DNA studies.  Her HIV titers have been negative as have  other  STI cultures.  She has been taking oxycodone 10 mg nightly for pain to help  with sleep.  She also has been taking ibuprofen for pain as well.  SHE IS  ALLERGIC TO PENICILLIN, ERYTHROMYCIN AND NAPROXEN.   Other past medical history includes tubal ligation in 1995 with spontaneous  abortion 2 months later.  She is a smoker of approximately one pack per day  and has done this intermittently for approximately 21 years.  She has a  history of the ethanol and nonprescription drug abuse but is currently not  using these substances.   FAMILY HISTORY:  Family history includes paternal grandmother with  cardiovascular disease, maternal grandmother and grandfather died of  carcinoma of the colon, the patient's sister has supernumerary kidneys and  on her history states no sinus formation in her head.  She underwent  bilateral mastectomy and hysterectomy because of cancer.   REVIEW OF SYSTEMS:  HEENT:  The patient has headaches.  CARDIORESPIRATORY:  Negative.  GASTROINTESTINAL:  She has irritable bowel syndrome and GERD  with  an Helicobacter pylori culture positive.  She states that she has had  emotional problems with depression in the past.  GENITOURINARY:  As in  present illness.  NEUROMUSCULAR:  Otherwise negative.   PHYSICAL EXAMINATION:  VITAL SIGNS:  Height 5 feet 4-1/4 inches, weight 259  pounds, blood pressure 138/76, pulse 80, respirations 18.  GENERAL:  Well-developed white female in no acute distress.  HEENT:  Within normal limits.  NECK:  Supple without masses, adenopathy, or bruits.  HEART:  Regular rate and rhythm without murmurs.  LUNGS:  Clear to P&A.  BREASTS:  Breasts without mass.  Axilla negative.  ABDOMEN:  Soft, some mass effect in the lower abdomen with tenderness,  increased panniculus.  PELVIC EXAM:  External genitalia, Bartholin, urethral and Skene's glands  within normal limits.  Cervix is high in the vagina and slightly inflamed  with some dark blood  through the os.  Uterus is mid position, approximately  [redacted] weeks gestational size, irregular and slightly tender.  Adnexal exam  reveals no palpable masses but slight tenderness bilaterally.  Anterior and  posterior cul-de-sac examination is confirmatory.  EXTREMITIES:  Within normal limits.  CENTRAL NERVOUS SYSTEM:  Grossly intact.  SKIN:  Without suspicious lesions.   IMPRESSION:  Uterine enlargement, abnormal uterine bleeding, pain secondary  to above, anemia secondary to above, gastroesophageal reflux disease (GERD)  with Helicobacter pylori positive cultures, history of substance abuse.   DISPOSITION:  As noted above.           ______________________________  Almedia Balls. [name], M.D.     SRF/MEDQ  D:  07/27/2005  T:  07/27/2005  Job:  [id number]

## 2010-07-08 NOTE — Discharge Summary (Signed)
[name], ASKARI NO.:  [id number]   MEDICAL RECORD NO.:  [id number]          PATIENT TYPE:  INP   LOCATION:  9318                          FACILITY:  [facility]   PHYSICIAN:  Almedia Balls. Fore, M.D.   DATE OF BIRTH:  1970/08/31   DATE OF ADMISSION:  08/03/2005  DATE OF DISCHARGE:  08/04/2005                                 DISCHARGE SUMMARY   HISTORY:  The patient is a 40 year old with uterini enlargement, pelvic  pain, abnormal uterine bleeding for a hysterectomy on June 14.  The  remainder of her history and physical are as previously dictated.  Laboratory data include hemoglobin 12.2.   HOSPITAL COURSE:  The patient was taken to the operating room on August 03, 2005 at which an abdomen supracervical hysterectomy was performed.  The  patient did well postoperatively.  Diet and ambulation were progressed over  the evening of 14 June and early morning of June 15.  On the morning of June  15, she was afebrile and experiencing no problems except for pain which was  controlled by oral analgesics.  It was felt that she could be discharged at  this time.   FINAL DIAGNOSES:  1.  Abnormal uterine bleeding, uterine enlargement, pelvic pain, history of      anemia, of positive lupus anticoagulance, history of hepatitis C,      substance abuse in the remote past.   OPERATION:  Abdomen supracervical hysterectomy.  Pathology report  unavailable at the time of dictation.   DISPOSITION:  Discharged home to return to the office in 2 weeks for  followup.  She was instructed to gradually progress her activities over  several weeks at home and limit lifting and driving for 2 weeks.  She was  fully ambulatory, on a regular diet, and in good condition at the time of  discharge.  She was given a prescription for Percocet 10/325 #30 to be taken  one q.5h p.r.n. pain, doxycycline 100 mg #12 to be taken 1 b.i.d., and  Plavix 75 mg #30 to be taken 1 daily.     ______________________________  Almedia Balls [name], M.D.     SRF/MEDQ  D:  08/04/2005  T:  08/04/2005  Job:  [id number]

## 2010-07-08 NOTE — Op Note (Signed)
[name], [name]              ACCOUNT NO.:  [id number]   MEDICAL RECORD NO.:  [id number]          Herrera TYPE:  AMB   LOCATION:  [facility]C                           FACILITY:  [facility]   PHYSICIAN:  Almedia Balls. Fore, M.D.   DATE OF BIRTH:  July 23, 1970   DATE OF PROCEDURE:  08/03/2005  DATE OF DISCHARGE:                                 OPERATIVE REPORT   PREOPERATIVE DIAGNOSIS:  Abnormal uterine bleeding, pelvic pain, uterine  enlargement.   POSTOPERATIVE DIAGNOSIS:  Abnormal uterine bleeding, pelvic pain, uterine  enlargement, pending pathology.   PROCEDURE:  Abdominal supracervical hysterectomy.   ANESTHESIA:  General orotracheal.   OPERATOR:  Almedia Balls. [name], M.D.   FIRST ASSISTANT:  Dr. Artist Pais.   INDICATIONS FOR SURGERY:  The Herrera is a 40 year old with the above-noted  problems who was counseled as to the need for surgery to treat these  problems and the type of procedure to be performed.  She was also counseled  as to the nature of the risks involved including risks of anesthesia, injury  to bowel, bladder, blood vessels, ureters, postoperative hemorrhage,  infection, recuperation and possible hormone replacement should her ovaries  be removed.  She fully understands all these considerations and wishes to  proceed and has signed informed consent to proceed on August 03, 2005.   OPERATIVE FINDINGS:  On entry into the peritoneal cavity, exploration of the  upper abdomen revealed the lower liver edge, area of the gallbladder,  spleen, kidneys, periaortic areas and appendix to be normal to palpation  and/or visualization.  In the pelvis, the uterine fundus was quite enlarged  to approximately [redacted] weeks gestational size and quite soft.  Both ovaries  appeared normal.  Tubes had been previously surgically interrupted with  clips for sterilization procedure.   PROCEDURE:  With the Herrera under general anesthesia, prepared and draped  in the usual sterile fashion, Foley catheter  in the bladder, lower abdominal  transverse incision was made and carried into the peritoneal cavity without  difficulty.  Small bleeders were rendered hemostatic with Bovie  electrocoagulation.  On entry into the peritoneum and after exploration, a  self-retaining retractor was placed, and the bowel was packed off.  Kelly  clamps were placed across the utero-ovarian anastomoses, tubes and round  ligaments bilaterally for traction and hemostasis.  All tissue was quite  friable and bled very easily.  Round ligaments bilaterally were secured with  Heaney clamps, clamped, and suture ligated with 0 Vicryl.  The bladder flap  was begun on the anterior surface of the uterus with entry into the  retroperitoneal space somewhat because of the limited mobility.  Heaney  clamps were then placed across the utero-ovarian anastomoses and tubes  bilaterally for conservation of the structures.  These structures were then  cut proximally and doubly ligated with 0 Vicryl.  Uterine vessels were then  skeletonized, clamped using Heaney clamps bilaterally, cut, and suture  ligated with 0 Vicryl.  Heaney clamps were then placed across the remaining  portions of the cardinal ligaments bilaterally.  The fundus was then  transected for better  visualization and technical ease.  The bladder flap  was continually advanced over the lower uterine segment and cervix.  The  straight Haney clamps bilaterally were then cut and the pedicles suture  ligated with 0 Vicryl.  The cervical stump and lower portion of the uterus  were then excised using Bovie electrocoagulation and reverse conization  method.  Cervical stump was then reapproximated and rendered hemostatic with  interrupted sutures of 0 Vicryl.  The area was lavaged with copious amounts  of lactated Ringer solution and after noting that hemostasis was maintained,  the area was reperitonealized with a continuous suture of 3-0 PDS.  At this  point with good hemostasis  and correct sponge and instrument count, the  peritoneum was closed with continuous suture of 0 Vicryl.  Fascia was closed  with two sutures of 0 PDS which were brought from the lateral aspects of the  incision and tied in the midline.  Subcutaneous fat was reapproximated with  interrupted horizontal mattress sutures of 0 PDS.  Skin was closed with  subcuticular suture of 3-0 plain catgut.  Estimated blood loss 500 mL.  The  Herrera was taken to the recovery room in good condition with clear urine in  Foley catheter tubing.  She will be placed on outpatient with extended  recovery status following surgery.   Addendum to the above:  It should be noted that the uterine fundus was sent  to pathology.           ______________________________  Almedia Balls [name], M.D.     SRF/MEDQ  D:  08/03/2005  T:  08/03/2005  Job:  [id number]   cc:   Artist Pais, M.D.  Fax: [phone]

## 2010-07-14 ENCOUNTER — Ambulatory Visit (INDEPENDENT_AMBULATORY_CARE_PROVIDER_SITE_OTHER): Payer: PRIVATE HEALTH INSURANCE | Admitting: Internal Medicine

## 2010-07-14 ENCOUNTER — Other Ambulatory Visit (INDEPENDENT_AMBULATORY_CARE_PROVIDER_SITE_OTHER): Payer: PRIVATE HEALTH INSURANCE

## 2010-07-14 ENCOUNTER — Encounter: Payer: Self-pay | Admitting: Internal Medicine

## 2010-07-14 ENCOUNTER — Telehealth: Payer: Self-pay | Admitting: *Deleted

## 2010-07-14 DIAGNOSIS — I10 Essential (primary) hypertension: Secondary | ICD-10-CM

## 2010-07-14 DIAGNOSIS — E119 Type 2 diabetes mellitus without complications: Secondary | ICD-10-CM

## 2010-07-14 LAB — BASIC METABOLIC PANEL
CO2: 26 mEq/L (ref 19–32)
Chloride: 106 mEq/L (ref 96–112)
Glucose, Bld: 172 mg/dL — ABNORMAL HIGH (ref 70–99)
Potassium: 4 mEq/L (ref 3.5–5.1)
Sodium: 138 mEq/L (ref 135–145)

## 2010-07-14 LAB — MICROALBUMIN / CREATININE URINE RATIO: Microalb Creat Ratio: 0.2 mg/g (ref 0.0–30.0)

## 2010-07-14 MED ORDER — METFORMIN HCL 500 MG PO TABS: 500.0000 mg | ORAL_TABLET | Freq: Two times a day (BID) | ORAL | Status: DC

## 2010-07-14 NOTE — Telephone Encounter (Signed)
Sent metformin to pharmacy...07/14/10@1 :42pm/LMB

## 2010-07-14 NOTE — Patient Instructions (Signed)
It was good to see you today. Test(s) ordered today. Your results will be called to you after review (48-72hours after test completion). If any changes need to be made, you will be notified at that time. Stop lisinopril/hctz pending the urine results due to low normal BP Other medications reviewed, no other changes at this time. Please schedule followup in 3-4 months, call sooner if problems.

## 2010-07-14 NOTE — Progress Notes (Signed)
Subjective:    Patient ID: [name], female    DOB: [birth year], 40 y.o.   MRN: [id number]  HPI  here for follow up - reviewed chronic medical issues:  DM2 - started on meds for same 09/2009- checks sugars <1x/day - no signs or symptoms hypo or hyperglycemia - reports varaible compliance with rx'd treatment, no changes in medication dose or frequency. denies adverse side effects related to current therapy.   bipolar hx -  well controlled but states she also uses FMLA days for times when bipolar "not so good" no recent med changes - but increase depression with recent stressors - school, relationship  migraines - fewer episodes since being on CPAP for OSA will use FMLA time during bad days increase and recent flare related to stress:  LBP - currently symptoms quiet/tolerable due to reduced stress Has worked with orrtho dr. 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 HEPATITIS C   . HYPONATREMIA, CHRONIC   . LEUKOCYTOSIS UNSPECIFIED     chronic s/p heme eval ([facility] 2010)  . TOBACCO DEPENDENCE   . MIGRAINE, UNSPEC., W/O INTRACTABLE MIGRAINE   . Irritable bowel syndrome   . UTERINE ENLARGEMENT 08/03/2005  . BACK PAIN   . RENAL CALCULUS, HX OF   . OSA (obstructive sleep apnea)   . DEPRESSION   . DIABETES MELLITUS, TYPE II   . ALLERGIC RHINITIS   . HYPERTENSION     Review of Systems  Constitutional: Negative for fever and unexpected weight change.  HENT: Negative for ear pain.   Respiratory: Negative for cough and shortness of breath.   Cardiovascular: Negative for chest pain and leg  swelling.  Neurological: Negative for headaches.       Objective:   Physical Exam BP 102/70  Pulse 79  Temp(Src) 98 F (36.7 C) (Oral)  Ht 5\' 4"  (1.626 m)  Wt 265 lb 12.8 oz (120.566 kg)  BMI 45.62 kg/m2  SpO2 97% Physical Exam  Constitutional: She is obese; oriented to person, place, and time. She appears well-developed and well-nourished. No distress.  Neck: Normal range of motion. Neck supple. No JVD present. No thyromegaly present.  Cardiovascular: Normal rate, regular rhythm and normal heart sounds.  No murmur heard. No BLE edema. Pulmonary/Chest: Effort normal and breath sounds normal. No respiratory distress. She has no wheezes.  Psychiatric: She has a normal mood and affect. Her behavior is normal. Judgment and thought content normal.   Lab Results  Component Value Date   WBC 15.6* 04/14/2010   HGB 15.1 04/26/2010   HCT 43.7 04/26/2010   PLT 340 04/26/2010   CHOL 138 04/14/2010   TRIG 120.0 04/14/2010   HDL 30.30* 04/14/2010   ALT 32 10/29/2008   AST 26 10/29/2008   NA 137 04/26/2010   K 4.1 04/26/2010   CL 104 04/26/2010   CREATININE 0.82 04/26/2010   BUN 15 04/26/2010   CO2 21 04/26/2010   TSH 0.43 04/14/2010   HGBA1C 7.3* 04/14/2010         Assessment &  Plan:  See problem list. Medications and labs reviewed today.

## 2010-07-14 NOTE — Assessment & Plan Note (Signed)
Low normal BP , non compliance with ACEI/hctx as prev rx'd - med dc'd pending microalb results for DM BP Readings from Last 3 Encounters:  07/14/10 102/70  06/24/10 108/70  04/14/10 112/72

## 2010-07-14 NOTE — Assessment & Plan Note (Signed)
Not taking ACEI/HCTZ and only 500mg  metformin qhs -  Recheck a1c now and urine microalb - reconsider ACEI needs and metformin dose depending on results Continue to work on lifestyle - diet and exercise - control  Lab Results  Component Value Date   HGBA1C 7.3* 04/14/2010

## 2010-07-16 IMAGING — CT CT ABDOMEN W/O CM
1 series · 14 of 22 positions shown, 19 images · non-contrast
Comparison: None

CT ABDOMEN

CLINICAL DATA: 38-year-old with left flank pain.

CT OF THE ABDOMEN AND PELVIS WITHOUT CONTRAST (CT UROGRAM)
TECHNIQUE: Multidetector CT imaging was performed through the
abdomen and pelvis to include the urinary tract.

[Series 4: lung windows · axial · 0.98mm/px · z∈[-159,-64]mm · 14 of 22 slices shown, 19 images]
[im 2/22  soft-tissue]
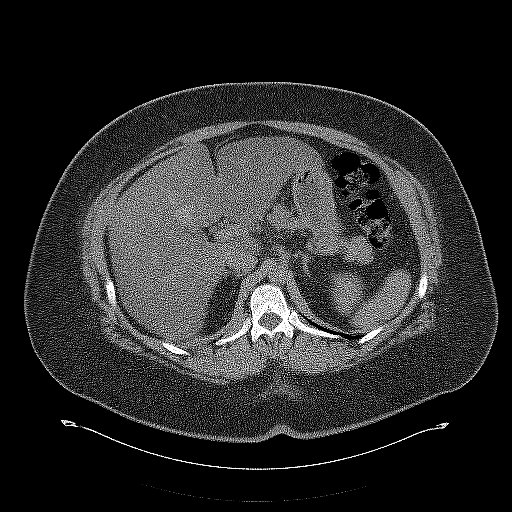
[im 2/22  bone]
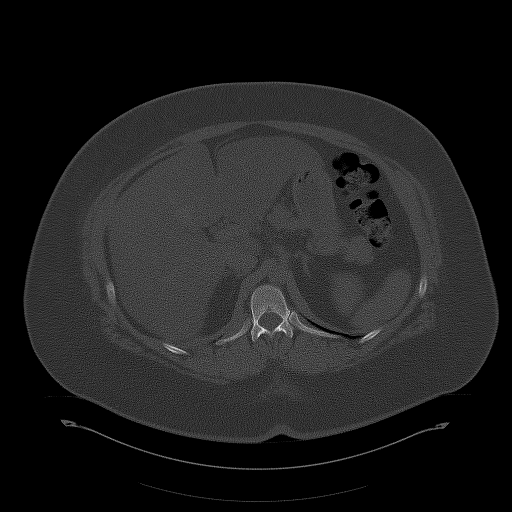
[im 4/22  soft-tissue]
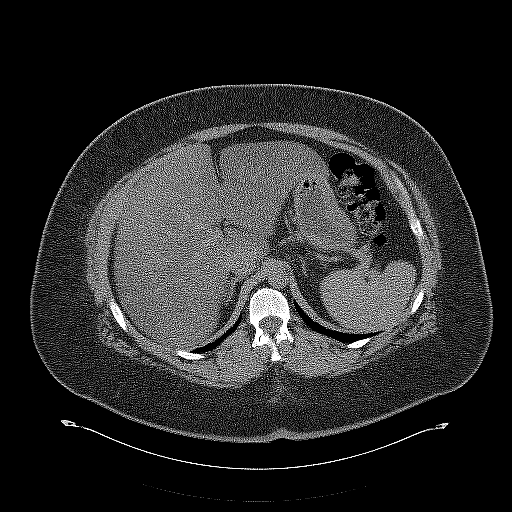
[im 5/22  soft-tissue]
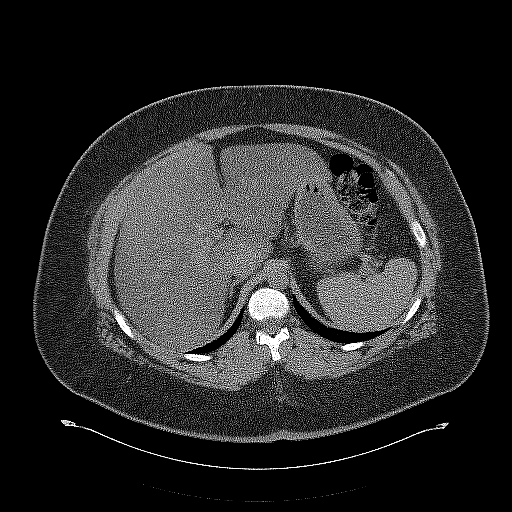
[im 7/22  soft-tissue]
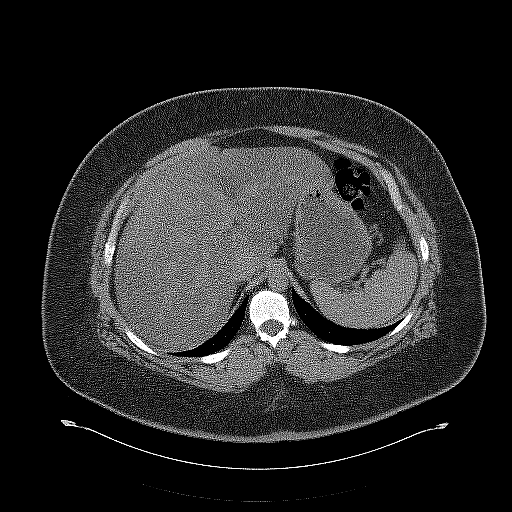
[im 8/22  soft-tissue]
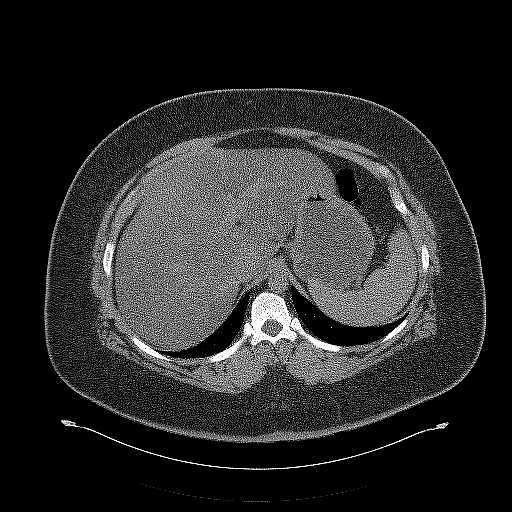
[im 10/22  soft-tissue]
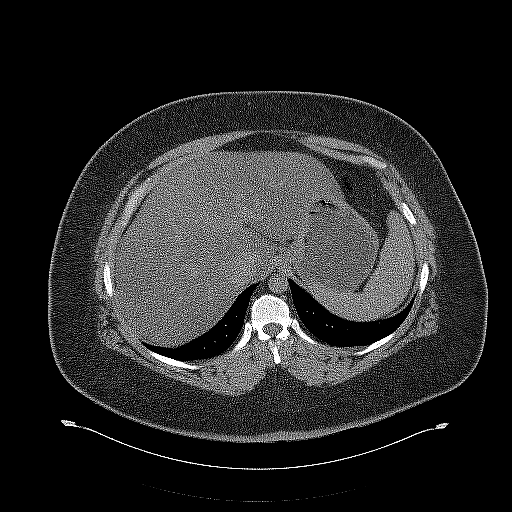
[im 12/22  soft-tissue]
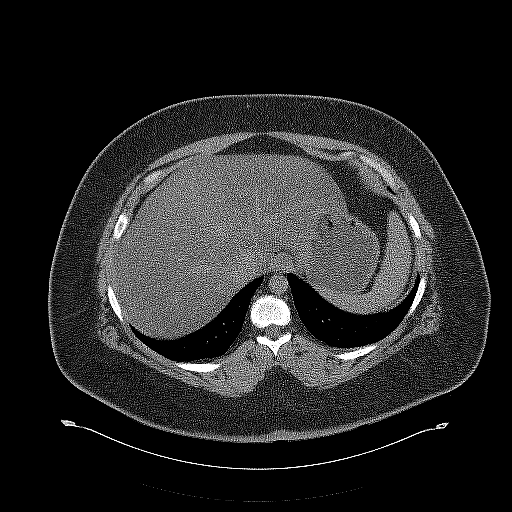
[im 13/22  soft-tissue]
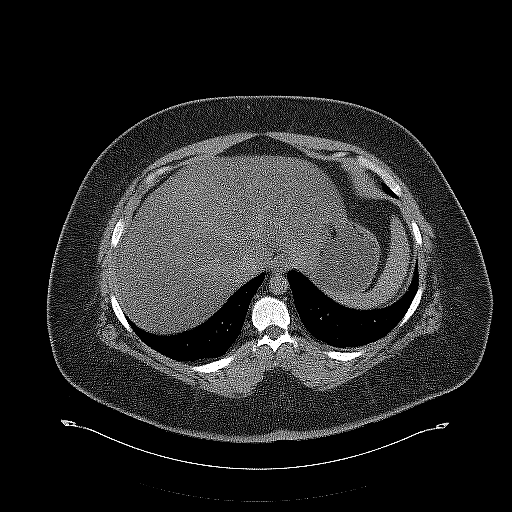
[im 15/22  soft-tissue]
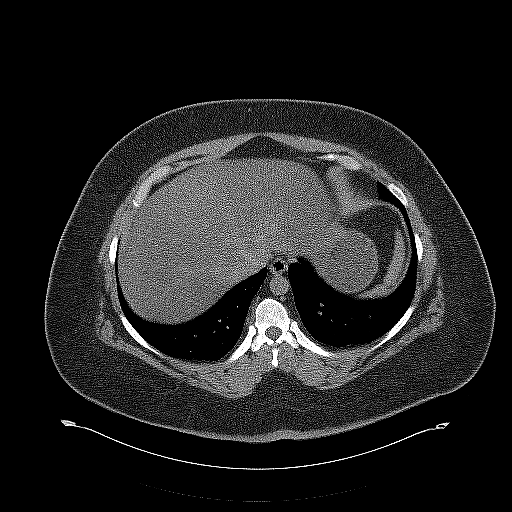
[im 15/22  bone]
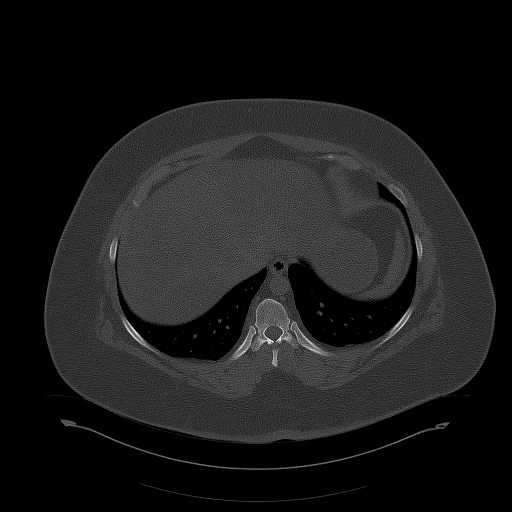
[im 16/22  soft-tissue]
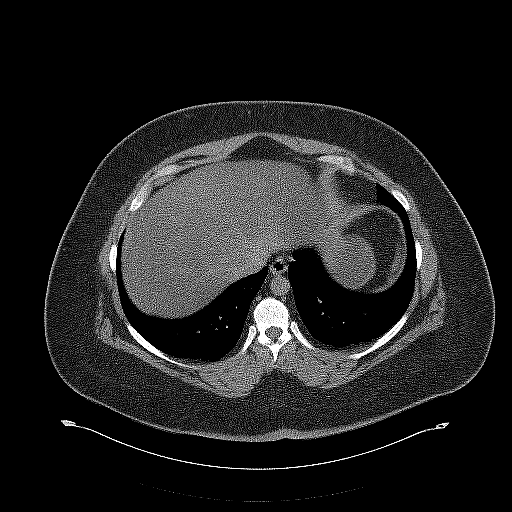
[im 18/22  soft-tissue]
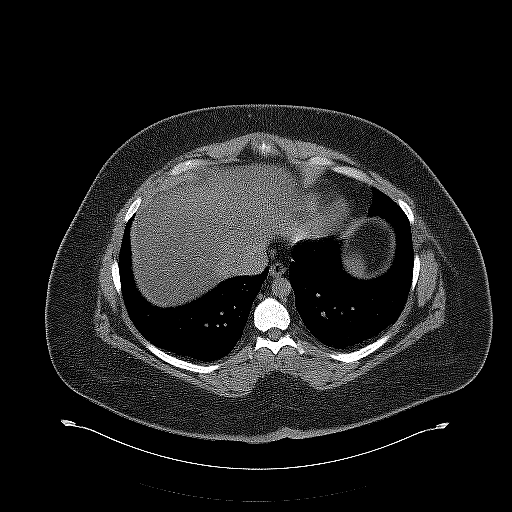
[im 18/22  lung]
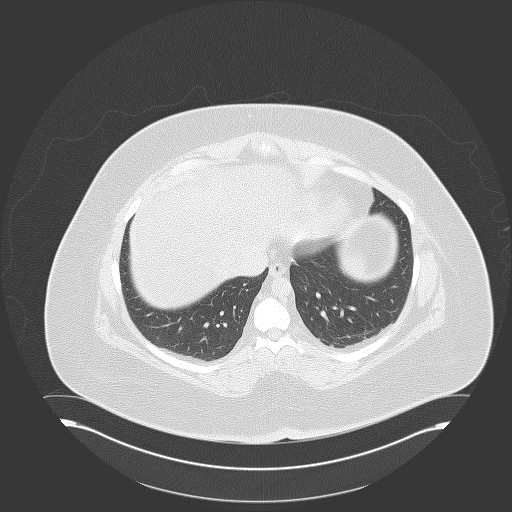
[im 19/22  soft-tissue]
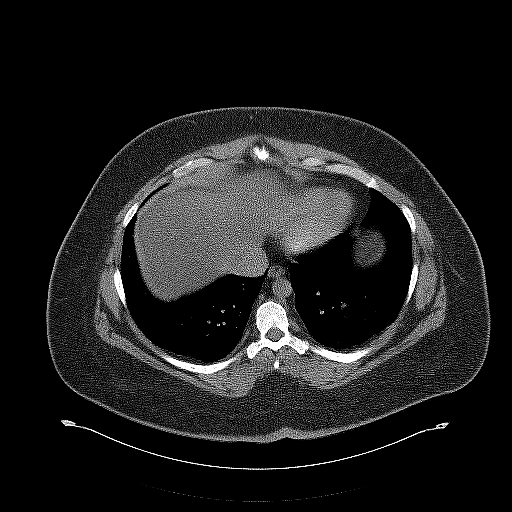
[im 19/22  lung]
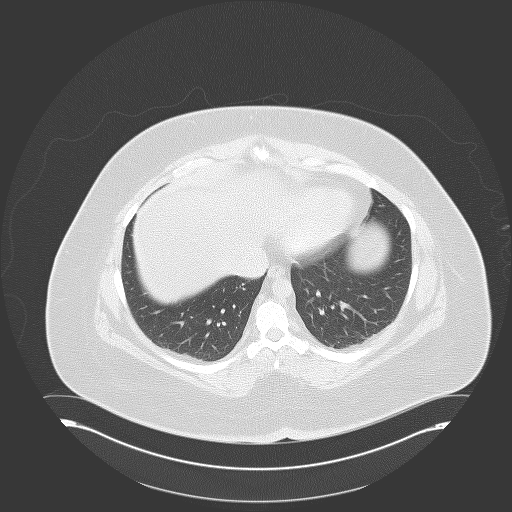
[im 20/22  lung]
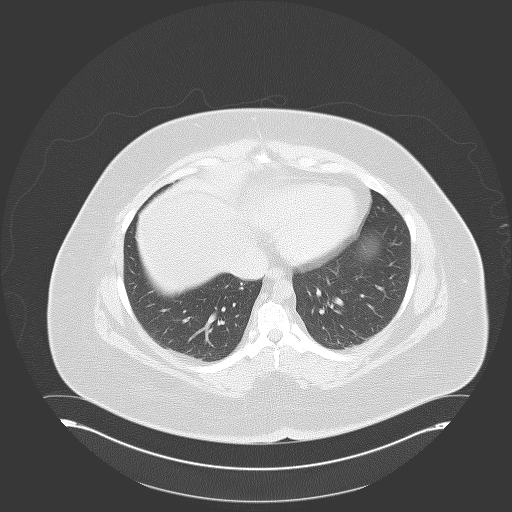
[im 21/22  soft-tissue]
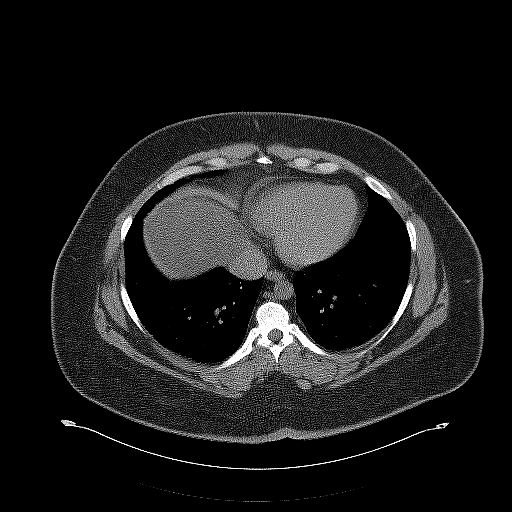
[im 21/22  lung]
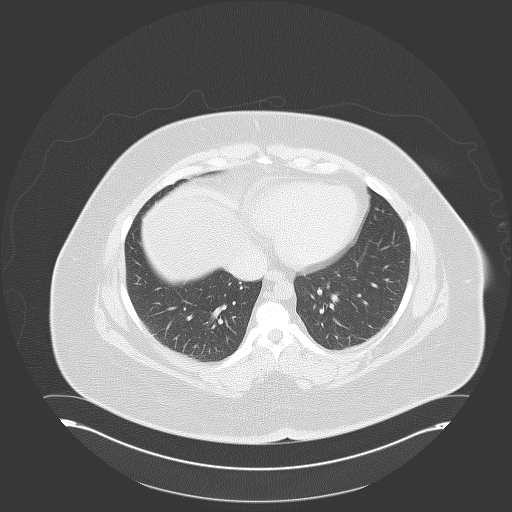

[14 of 22 positions shown; findings below may reference images not displayed]

FINDINGS: The lung bases are clear.  Punctate density in the
posterior left lower lobe measures approximately 3 mm.  No evidence
for free air. Unenhanced CT was performed per clinician order.
Lack of IV contrast limits sensitivity and specificity, especially
for evaluation of abdominal/pelvic solid viscera. Diffusely
decreased attenuation of the liver consistent with hepatic
steatosis.  There is probably fatty sparing adjacent to the
gallbladder.  No gross abnormalities to the spleen, adrenals
stomach, spleen or kidneys.  No evidence for kidney stones or
hydronephrosis.  Appendix appears small without inflammation.
There is no significant free fluid or lymphadenopathy in the
abdomen.
IMPRESSION: No acute abdominal findings.  Negative for kidney stones or
hydronephrosis.

Fat infiltration of the liver.

Punctate left lower lobe nodule. If the patient is at high risk for
bronchogenic carcinoma, follow-up chest CT at 1 year is
recommended.  If the patient is at low risk, no follow-up is
needed.  This recommendation follows the consensus statement:
"Guidelines for Management of Small Pulmonary Nodules Detected on
CT Scans:  A Statement from the [HOSPITAL]" as published in
[URL]

CT PELVIS
FINDINGS: There is a round structure involving the right adnexa
that measures 4.9 x 4.2 cm.  There is a surgical clip in this
region consistent with a tubal ligation.  The patient is probably
status post hysterectomy. The patient has a prominent vaginal cuff
or retained cervix.  The left adnexal structures in the left lower
quadrant of the abdomen are grossly normal.  Fluid in the urinary
bladder.  No significant free fluid or lymphadenopathy in the
pelvis.
IMPRESSION: 4.9 cm right adnexal lesion.  Structure is slightly hyperdense and
could represent a complex cyst.  Recommend further characterization
with ultrasound.

## 2010-11-25 LAB — GC/CHLAMYDIA PROBE AMP, GENITAL
Chlamydia, DNA Probe: NEGATIVE
GC Probe Amp, Genital: NEGATIVE

## 2011-07-11 ENCOUNTER — Emergency Department (HOSPITAL_COMMUNITY)
Admission: EM | Admit: 2011-07-11 | Discharge: 2011-07-11 | Disposition: A | Discharge status: Home / Self Care | Payer: Self-pay | Attending: Emergency Medicine | Admitting: Emergency Medicine

## 2011-07-11 ENCOUNTER — Encounter (HOSPITAL_COMMUNITY): Payer: Self-pay | Admitting: Emergency Medicine

## 2011-07-11 DIAGNOSIS — Z87442 Personal history of urinary calculi: Secondary | ICD-10-CM | POA: Insufficient documentation

## 2011-07-11 DIAGNOSIS — I10 Essential (primary) hypertension: Secondary | ICD-10-CM | POA: Insufficient documentation

## 2011-07-11 DIAGNOSIS — G4733 Obstructive sleep apnea (adult) (pediatric): Secondary | ICD-10-CM | POA: Insufficient documentation

## 2011-07-11 DIAGNOSIS — R202 Paresthesia of skin: Secondary | ICD-10-CM

## 2011-07-11 DIAGNOSIS — E871 Hypo-osmolality and hyponatremia: Secondary | ICD-10-CM | POA: Insufficient documentation

## 2011-07-11 DIAGNOSIS — D72829 Elevated white blood cell count, unspecified: Secondary | ICD-10-CM | POA: Insufficient documentation

## 2011-07-11 DIAGNOSIS — F17200 Nicotine dependence, unspecified, uncomplicated: Secondary | ICD-10-CM | POA: Insufficient documentation

## 2011-07-11 DIAGNOSIS — E119 Type 2 diabetes mellitus without complications: Secondary | ICD-10-CM | POA: Insufficient documentation

## 2011-07-11 DIAGNOSIS — R209 Unspecified disturbances of skin sensation: Secondary | ICD-10-CM | POA: Insufficient documentation
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
## 2011-07-11 NOTE — ED Provider Notes (Signed)
Medical screening examination/treatment/procedure(s) were performed by non-physician practitioner and as supervising physician I was immediately available for consultation/collaboration.  Ethelda Chick, MD 07/11/11 2221

## 2011-07-11 NOTE — ED Notes (Signed)
PT. REPORTS NUMBNESS AT LEFT 4TH/5TH FINGER ONSET THIS EVENING , DENIES INJURY.

## 2011-07-11 NOTE — Discharge Instructions (Signed)
Read the information below.  Please follow up with the neurologists above for a nerve conduction study.  Please also follow up with Dr Felicity Coyer. If you develop weakness of your hand or arm, any difficulty walking or speaking, or numbness or weakness of any other extremities, return to the ER immediately.  You may return to the ER at any time for worsening condition or any new symptoms that concern you.   Paresthesia Paresthesia is an abnormal burning or prickling sensation. This sensation is generally felt in the hands, arms, legs, or feet. However, it may occur in any part of the body. It is usually not painful. The feeling may be described as:  Tingling or numbness.   "Pins and needles."   Skin crawling.   Buzzing.   Limbs "falling asleep."   Itching.  Most people experience temporary (transient) paresthesia at some time in their lives. CAUSES  Paresthesia may occur when you breathe too quickly (hyperventilation). It can also occur without any apparent cause. Commonly, paresthesia occurs when pressure is placed on a nerve. The feeling quickly goes away once the pressure is removed. For some people, however, paresthesia is a long-lasting (chronic) condition caused by an underlying disorder. The underlying disorder may be:  A traumatic, direct injury to nerves. Examples include a:   Broken (fractured) neck.   Fractured skull.   A disorder affecting the brain and spinal cord (central nervous system). Examples include:   Transverse myelitis.   Encephalitis.   Transient ischemic attack.   Multiple sclerosis.   Stroke.   Tumor or blood vessel problems, such as an arteriovenous malformation pressing against the brain or spinal cord.   A condition that damages the peripheral nerves (peripheral neuropathy). Peripheral nerves are not part of the brain and spinal cord. These conditions include:   Diabetes.   Peripheral vascular disease.   Nerve entrapment syndromes, such as  carpal tunnel syndrome.   Shingles.   Hypothyroidism.   Vitamin B12 deficiencies.   Alcoholism.   Heavy metal poisoning (lead, arsenic).   Rheumatoid arthritis.   Systemic lupus erythematosus.  DIAGNOSIS  Your caregiver will attempt to find the underlying cause of your paresthesia. Your caregiver may:  Take your medical history.   Perform a physical exam.   Order various lab tests.   Order imaging tests.  TREATMENT  Treatment for paresthesia depends on the underlying cause. HOME CARE INSTRUCTIONS  Avoid drinking alcohol.   You may consider massage or acupuncture to help relieve your symptoms.   Keep all follow-up appointments as directed by your caregiver.  SEEK IMMEDIATE MEDICAL CARE IF:   You feel weak.   You have trouble walking or moving.   You have problems with speech or vision.   You feel confused.   You cannot control your bladder or bowel movements.   You feel numbness after an injury.   You faint.   Your burning or prickling feeling gets worse when walking.   You have pain, cramps, or dizziness.   You develop a rash.  MAKE SURE YOU:  Understand these instructions.   Will watch your condition.   Will get help right away if you are not doing well or get worse.  Document Released: 01/27/2002 Document Revised: 01/26/2011 Document Reviewed: 10/28/2010 [facility] Patient Information 2012 [place], [place].

## 2012-06-05 DIAGNOSIS — M549 Dorsalgia, unspecified: Secondary | ICD-10-CM | POA: Insufficient documentation

## 2012-06-05 DIAGNOSIS — Z8659 Personal history of other mental and behavioral disorders: Secondary | ICD-10-CM | POA: Insufficient documentation

## 2012-06-05 DIAGNOSIS — I509 Heart failure, unspecified: Secondary | ICD-10-CM | POA: Insufficient documentation

## 2012-06-05 DIAGNOSIS — Z8719 Personal history of other diseases of the digestive system: Secondary | ICD-10-CM | POA: Insufficient documentation

## 2012-06-05 DIAGNOSIS — R079 Chest pain, unspecified: Secondary | ICD-10-CM | POA: Insufficient documentation

## 2012-06-05 DIAGNOSIS — Z8639 Personal history of other endocrine, nutritional and metabolic disease: Secondary | ICD-10-CM | POA: Insufficient documentation

## 2012-06-05 DIAGNOSIS — K625 Hemorrhage of anus and rectum: Secondary | ICD-10-CM | POA: Insufficient documentation

## 2012-06-05 DIAGNOSIS — R319 Hematuria, unspecified: Secondary | ICD-10-CM | POA: Insufficient documentation

## 2012-06-05 DIAGNOSIS — K921 Melena: Secondary | ICD-10-CM | POA: Insufficient documentation

## 2012-06-05 DIAGNOSIS — Z9981 Dependence on supplemental oxygen: Secondary | ICD-10-CM | POA: Insufficient documentation

## 2012-06-05 DIAGNOSIS — Z8614 Personal history of Methicillin resistant Staphylococcus aureus infection: Secondary | ICD-10-CM | POA: Insufficient documentation

## 2012-06-05 DIAGNOSIS — Z87891 Personal history of nicotine dependence: Secondary | ICD-10-CM | POA: Insufficient documentation

## 2012-06-05 DIAGNOSIS — Z87442 Personal history of urinary calculi: Secondary | ICD-10-CM | POA: Insufficient documentation

## 2012-06-05 DIAGNOSIS — Z862 Personal history of diseases of the blood and blood-forming organs and certain disorders involving the immune mechanism: Secondary | ICD-10-CM | POA: Insufficient documentation

## 2012-06-05 DIAGNOSIS — Z8619 Personal history of other infectious and parasitic diseases: Secondary | ICD-10-CM | POA: Insufficient documentation

## 2012-06-05 DIAGNOSIS — R0602 Shortness of breath: Secondary | ICD-10-CM | POA: Insufficient documentation

## 2012-06-05 DIAGNOSIS — E119 Type 2 diabetes mellitus without complications: Secondary | ICD-10-CM | POA: Insufficient documentation

## 2012-06-05 DIAGNOSIS — I10 Essential (primary) hypertension: Secondary | ICD-10-CM | POA: Insufficient documentation

## 2012-06-05 DIAGNOSIS — G4733 Obstructive sleep apnea (adult) (pediatric): Secondary | ICD-10-CM | POA: Insufficient documentation

## 2012-06-06 ENCOUNTER — Emergency Department (HOSPITAL_COMMUNITY): Payer: Self-pay

## 2012-06-06 ENCOUNTER — Encounter (HOSPITAL_COMMUNITY): Payer: Self-pay | Admitting: Emergency Medicine

## 2012-06-06 ENCOUNTER — Emergency Department (HOSPITAL_COMMUNITY)
Admission: EM | Admit: 2012-06-06 | Discharge: 2012-06-06 | Disposition: A | Discharge status: Home / Self Care | Payer: Self-pay | Attending: Emergency Medicine | Admitting: Emergency Medicine

## 2012-06-06 DIAGNOSIS — K625 Hemorrhage of anus and rectum: Secondary | ICD-10-CM

## 2012-06-06 DIAGNOSIS — M549 Dorsalgia, unspecified: Secondary | ICD-10-CM

## 2012-06-06 DIAGNOSIS — R079 Chest pain, unspecified: Secondary | ICD-10-CM

## 2012-06-06 DIAGNOSIS — R319 Hematuria, unspecified: Secondary | ICD-10-CM

## 2012-06-06 HISTORY — DX: Personal history of Methicillin resistant Staphylococcus aureus infection: Z86.14

## 2012-06-06 HISTORY — DX: Heart failure, unspecified: I50.9

## 2012-06-06 LAB — URINALYSIS, ROUTINE W REFLEX MICROSCOPIC
Bilirubin Urine: NEGATIVE
Glucose, UA: NEGATIVE mg/dL
Ketones, ur: NEGATIVE mg/dL
pH: 6 (ref 5.0–8.0)

## 2012-06-06 LAB — COMPREHENSIVE METABOLIC PANEL
ALT: 81 U/L — ABNORMAL HIGH (ref 0–35)
AST: 37 U/L (ref 0–37)
Albumin: 3.1 g/dL — ABNORMAL LOW (ref 3.5–5.2)
Alkaline Phosphatase: 111 U/L (ref 39–117)
Glucose, Bld: 218 mg/dL — ABNORMAL HIGH (ref 70–99)
Potassium: 3.7 mEq/L (ref 3.5–5.1)
Sodium: 134 mEq/L — ABNORMAL LOW (ref 135–145)
Total Protein: 7.6 g/dL (ref 6.0–8.3)

## 2012-06-06 LAB — CBC
Hemoglobin: 15.9 g/dL — ABNORMAL HIGH (ref 12.0–15.0)
MCHC: 36.1 g/dL — ABNORMAL HIGH (ref 30.0–36.0)

## 2012-06-06 LAB — URINE MICROSCOPIC-ADD ON

## 2012-06-06 LAB — POCT I-STAT TROPONIN I: Troponin i, poc: 0 ng/mL (ref 0.00–0.08)

## 2012-06-06 MED ORDER — KETOROLAC TROMETHAMINE 60 MG/2ML IM SOLN
60.0000 mg | Freq: Once | INTRAMUSCULAR | Status: AC
  Administered 2012-06-06: 60 mg via INTRAMUSCULAR
  Filled 2012-06-06: qty 2

## 2012-06-06 MED ORDER — TRAMADOL HCL 50 MG PO TABS: 50.0000 mg | ORAL_TABLET | Freq: Four times a day (QID) | ORAL | Status: DC | PRN

## 2012-06-06 NOTE — ED Notes (Signed)
EKG given to EDP, [place], MD. No old EKG.

## 2012-06-06 NOTE — ED Provider Notes (Signed)
Medical screening examination/treatment/procedure(s) were performed by non-physician practitioner and as supervising physician I was immediately available for consultation/collaboration.   Janis Cuffe L Maribelle Hopple, MD 06/06/12 0702 

## 2012-06-06 NOTE — ED Provider Notes (Signed)
History     CSN: [id number]  Arrival date & time 06/05/12  2358   First MD Initiated Contact with Patient 06/06/12 0111      Chief Complaint  Patient presents with  . Rectal Bleeding    (Consider location/radiation/quality/duration/timing/severity/associated sxs/prior treatment) HPI Comments: 42 year old female with a past medical history of CHF, diabetes, hepatitis C, chronic low back pain among multiple other medical problems presents to the emergency department with multiple complaints. Patient states over the past 3 days she has had a pressure feeling over her rectum, when she went to move her bowels around 11:30 PM yesterday a teaspoon of bright red blood came out instead of stool. Earlier in the day she had a bowel movement which was green. Denies pain when she moves her bowels or straining. Also complaining of midsternal chest pain rated 3-4/10 radiating to the left over the past 3 days. She tried taking the ibuprofen which she takes for her chronic low back pain without relief. Admits to associated shortness of breath and leg swelling. Currently she does not take any medications. In the past she has taken lisinopril and hydrochlorothiazide for high blood pressure, however since she did not have insurance at this time she does not see a PCP. She tried to wait until May when she gets insurance to be evaluated for her leg swelling, however since she had rectal bleeding today she decided to come to the emergency department. Also complaining of low back pain which is chronic but currently severe. Ibuprofen and Robaxin provides no relief. Denies pain, numbness or tingling down her extremities. No urinary changes. Denies vaginal bleeding or pain. History of hysterectomy.  Patient is a 42 y.o. female presenting with hematochezia. The history is provided by the patient.  Rectal Bleeding  Associated symptoms include rectal pain and chest pain. Pertinent negatives include no fever, no abdominal pain,  no nausea, no vomiting and no vaginal bleeding.    Past Medical History  Diagnosis Date  . HEPATITIS C   . HYPONATREMIA, CHRONIC   . LEUKOCYTOSIS UNSPECIFIED     chronic s/p heme eval (odogwu 2010)  . TOBACCO DEPENDENCE   . MIGRAINE, UNSPEC., W/O INTRACTABLE MIGRAINE   . Irritable bowel syndrome   . RENAL CALCULUS, HX OF   . OSA (obstructive sleep apnea)     CPAP qhs  . DEPRESSION   . DIABETES MELLITUS, TYPE II   . ALLERGIC RHINITIS   . HYPERTENSION   . Hx MRSA infection   . CHF (congestive heart failure)     Past Surgical History  Procedure Laterality Date  . Abdominal hysterectomy  2007  . Tubal ligation  1995  . Liver biopsy  1999    Family History  Problem Relation Age of Onset  . Diabetes Mother   . Breast cancer Maternal Grandmother   . Alcohol abuse Other     parents  . Colon cancer Other     grandparent  . Lung cancer Other     grandparent  . Ovarian cancer Other     grandparent  . Heart disease Other     parent    History  Substance Use Topics  . Smoking status: Former Smoker -- 0.50 packs/day for 20 years    Types: Cigarettes  . Smokeless tobacco: Not on file  . Alcohol Use: No    OB History   Grav Para Term Preterm Abortions TAB SAB Ect Mult Living  Review of Systems  Constitutional: Negative for fever and chills.  Respiratory: Positive for shortness of breath.   Cardiovascular: Positive for chest pain and leg swelling.  Gastrointestinal: Positive for blood in stool, hematochezia and rectal pain. Negative for nausea, vomiting, abdominal pain and abdominal distention.  Genitourinary: Negative for vaginal bleeding, difficulty urinating, pelvic pain and dyspareunia.  Musculoskeletal: Positive for back pain.    Allergies  Erythromycin; Ginseng; Mushroom extract complex; Penicillins; Pineapple; Sulfa antibiotics; and Other  Home Medications   Current Outpatient Rx  Name  Route  Sig  Dispense  Refill  . ibuprofen  (ADVIL,MOTRIN) 200 MG tablet   Oral   Take 800 mg by mouth every 6 (six) hours as needed for pain.         . methocarbamol (ROBAXIN) 500 MG tablet   Oral   Take 250 mg by mouth daily as needed (for hip pain or spasms).           BP 139/66  Pulse 78  Temp(Src) 98.6 F (37 C) (Oral)  Resp 18  Ht 5\' 4"  (1.626 m)  Wt 240 lb (108.863 kg)  BMI 41.18 kg/m2  SpO2 100%  Physical Exam  Nursing note and vitals reviewed. Constitutional: She is oriented to person, place, and time. She appears well-developed and well-nourished.  Overweight, uncomfortable with moving around, NAD.  HENT:  Head: Normocephalic and atraumatic.  Mouth/Throat: Oropharynx is clear and moist.  Eyes: Conjunctivae and EOM are normal. Pupils are equal, round, and reactive to light.  Neck: Normal range of motion. Neck supple.  Cardiovascular: Normal rate, regular rhythm, normal heart sounds and intact distal pulses.   +1 pitting edema LE bilateral  Pulmonary/Chest: Effort normal and breath sounds normal. No respiratory distress. She has no wheezes. She has no rales.  Abdominal: Soft. Bowel sounds are normal. There is no tenderness.  Genitourinary: Rectal exam shows external hemorrhoid (no thrombosis or bleeding). Rectal exam shows no internal hemorrhoid, no fissure, no mass and no tenderness. Guaiac negative stool.  No gross blood on exam glove per rectum.  Musculoskeletal: She exhibits no edema.  Neurological: She is alert and oriented to person, place, and time.  Skin: Skin is warm and dry.  Psychiatric: She has a normal mood and affect. Her behavior is normal.    ED Course  Procedures (including critical care time)  Labs Reviewed  CBC - Abnormal; Notable for the following:    WBC 16.9 (*)    Hemoglobin 15.9 (*)    MCHC 36.1 (*)    All other components within normal limits  COMPREHENSIVE METABOLIC PANEL - Abnormal; Notable for the following:    Sodium 134 (*)    Glucose, Bld 218 (*)    BUN 25 (*)     Albumin 3.1 (*)    ALT 81 (*)    All other components within normal limits  URINALYSIS, ROUTINE W REFLEX MICROSCOPIC - Abnormal; Notable for the following:    Hgb urine dipstick LARGE (*)    Protein, ur 30 (*)    All other components within normal limits  PRO B NATRIURETIC PEPTIDE  URINE MICROSCOPIC-ADD ON  OCCULT BLOOD, POC DEVICE  POCT I-STAT TROPONIN I    Date: 06/06/2012  Rate: 75  Rhythm: normal sinus rhythm  QRS Axis: normal  Intervals: normal  ST/T Wave abnormalities: normal  Conduction Disutrbances:none  Narrative Interpretation: no stemi  Old EKG Reviewed: none available   Ct Abdomen Pelvis Wo Contrast  06/06/2012  *RADIOLOGY REPORT*  Clinical  Data: Lower back pain and swelling; left hip pain.  Rectal bleeding.  Leukocytosis and microhematuria.  CT ABDOMEN AND PELVIS WITHOUT CONTRAST  Technique:  Multidetector CT imaging of the abdomen and pelvis was performed following the standard protocol without intravenous contrast.  Comparison: CT of the abdomen and pelvis, and pelvic ultrasound performed 10/29/2008  Findings: The visualized lung bases are clear.  There is diffuse fatty infiltration within the liver, with mild sparing about the gallbladder fossa.  The liver is otherwise unremarkable in appearance.  The spleen is within normal limits. The gallbladder is normal in appearance.  The pancreas and adrenal glands are unremarkable.  The kidneys are unremarkable in appearance.  There is no evidence of hydronephrosis.  No renal or ureteral stones are seen.  No perinephric stranding is appreciated.  No free fluid is identified.  The small bowel is unremarkable in appearance.  The stomach is within normal limits.  No acute vascular abnormalities are seen.  The appendix is normal in caliber, without evidence for appendicitis.  The colon is unremarkable in appearance.  The bladder is mildly distended and grossly unremarkable in appearance.  The patient is status post hysterectomy.  The  ovaries are relatively symmetric; no suspicious adnexal masses are seen. Postoperative change is noted adjacent to the right ovary.  No inguinal lymphadenopathy is seen.  No acute osseous abnormalities are identified.  Facet disease is noted at the lower lumbar spine.  The left hip is grossly unremarkable in appearance; no hip joint effusion is seen.  IMPRESSION:  1.  No acute abnormalities seen within the abdomen or pelvis. 2.  Diffuse fatty infiltration within the liver.   Original Report Authenticated By: Tonia Ghent, M.D.    Dg Chest 2 View  06/06/2012  *RADIOLOGY REPORT*  Clinical Data: Chest pain of 24 hours.  CHEST - 2 VIEW  Comparison: Chest radiograph performed 06/24/2008  Findings: The lungs are well-aerated.  Pulmonary vascularity is at the upper limits of normal.  There is no evidence of focal opacification, pleural effusion or pneumothorax.  The heart is normal in size; the mediastinal contour is within normal limits.  No acute osseous abnormalities are seen.  IMPRESSION: No acute cardiopulmonary process seen.   Original Report Authenticated By: Tonia Ghent, M.D.      1. Rectal bleeding   2. Chest pain   3. Hematuria   4. Back pain       MDM  42 y/o female presenting with multiple complaints. Extensive medical history including HTN, CHF, DM. Troponin negative. BNP WNL. Hemoccult negative. Normal EKG. U/A with blood. Patient no longer has menses s/p hysterectomy. Leukocytosis of 16.9. Chest and low back pain improved with toradol. She is resting more comfortably in bed at this time. Obtaining CT abdomen/pelvis without contrast to evaluate for kidney stone.  5:45 AM CT scan negative for any acute abnormality. Pain still with improvement from toradol. She is in NAD. Vitals stable. Discharge with ultram for pain, she has robaxin at home. Resource guide given for PCP follow up. Return precautions discussed. Patient states understanding of plan and is agreeable.       Trevor Mace, PA-C 06/06/12 [phone]

## 2012-06-06 NOTE — ED Notes (Signed)
POCT Occult blood stool resulted neg.

## 2012-06-06 NOTE — ED Notes (Signed)
Pt states for the past 3 days she has swelling in her lower back that has been causing her to feel like she had to have a bowel movement  Pt states she is having pain in her left hip  Pt states tonight she started to have rectal bleeding   Pt states has recently had a MRSA infection and was on antibiotics that caused her to have a yeast infection   Pt states she has had multiple problems over the past 2.5 months

## 2013-10-20 ENCOUNTER — Ambulatory Visit (INDEPENDENT_AMBULATORY_CARE_PROVIDER_SITE_OTHER): Payer: Self-pay | Admitting: Emergency Medicine

## 2013-10-20 VITALS — BP 114/68 | HR 67 | Temp 98.2°F | Resp 18 | Ht 64.5 in | Wt 245.2 lb

## 2013-10-20 DIAGNOSIS — Z23 Encounter for immunization: Secondary | ICD-10-CM

## 2013-10-20 DIAGNOSIS — L03119 Cellulitis of unspecified part of limb: Secondary | ICD-10-CM

## 2013-10-20 DIAGNOSIS — L02419 Cutaneous abscess of limb, unspecified: Secondary | ICD-10-CM

## 2013-10-20 DIAGNOSIS — L03115 Cellulitis of right lower limb: Secondary | ICD-10-CM

## 2013-10-20 MED ORDER — CIPROFLOXACIN HCL 500 MG PO TABS: 500.0000 mg | ORAL_TABLET | Freq: Two times a day (BID) | ORAL | Status: DC

## 2013-10-20 NOTE — Patient Instructions (Addendum)
Cellulitis Cellulitis is an infection of the skin and the tissue beneath it. The infected area is usually red and tender. Cellulitis occurs most often in the arms and lower legs.  CAUSES  Cellulitis is caused by bacteria that enter the skin through cracks or cuts in the skin. The most common types of bacteria that cause cellulitis are staphylococci and streptococci. SIGNS AND SYMPTOMS   Redness and warmth.  Swelling.  Tenderness or pain.  Fever. DIAGNOSIS  Your health care provider can usually determine what is wrong based on a physical exam. Blood tests may also be done. TREATMENT  Treatment usually involves taking an antibiotic medicine. HOME CARE INSTRUCTIONS   Take your antibiotic medicine as directed by your health care provider. Finish the antibiotic even if you start to feel better.  Keep the infected arm or leg elevated to reduce swelling.  Apply a warm cloth to the affected area up to 4 times per day to relieve pain.  Take medicines only as directed by your health care provider.  Keep all follow-up visits as directed by your health care provider. SEEK MEDICAL CARE IF:   You notice red streaks coming from the infected area.  Your red area gets larger or turns dark in color.  Your bone or joint underneath the infected area becomes painful after the skin has healed.  Your infection returns in the same area or another area.  You notice a swollen bump in the infected area.  You develop new symptoms.  You have a fever. SEEK IMMEDIATE MEDICAL CARE IF:   You feel very sleepy.  You develop vomiting or diarrhea.  You have a general ill feeling (malaise) with muscle aches and pains. MAKE SURE YOU:   Understand these instructions.  Will watch your condition.  Will get help right away if you are not doing well or get worse. Document Released: 11/16/2004 Document Revised: 06/23/2013 Document Reviewed: 04/24/2011 [facility] Patient Information 2015 [place], [place].  This information is not intended to replace advice given to you by your health care provider. Make sure you discuss any questions you have with your health care provider. Tdap Vaccine (Tetanus, Diphtheria, Pertussis): What You Need to Know 1. Why get vaccinated? Tetanus, diphtheria and pertussis can be very serious diseases, even for adolescents and adults. Tdap vaccine can protect [place] from these diseases. TETANUS (Lockjaw) causes painful muscle tightening and stiffness, usually all over the body.  It can lead to tightening of muscles in the head and neck so you can't open your mouth, swallow, or sometimes even breathe. Tetanus kills about 1 out of 5 people who are infected. DIPHTHERIA can cause a thick coating to form in the back of the throat.  It can lead to breathing problems, paralysis, heart failure, and death. PERTUSSIS (Whooping Cough) causes severe coughing spells, which can cause difficulty breathing, vomiting and disturbed sleep.  It can also lead to weight loss, incontinence, and rib fractures. Up to 2 in 100 adolescents and 5 in 100 adults with pertussis are hospitalized or have complications, which could include pneumonia or death. These diseases are caused by bacteria. Diphtheria and pertussis are spread from person to person through coughing or sneezing. Tetanus enters the body through cuts, scratches, or wounds. Before vaccines, the [place] saw as many as 200,000 cases a year of diphtheria and pertussis, and hundreds of cases of tetanus. Since vaccination began, tetanus and diphtheria have dropped by about 99% and pertussis by about 80%. 2. Tdap vaccine Tdap vaccine can  protect adolescents and adults from tetanus, diphtheria, and pertussis. One dose of Tdap is routinely given at age 4 or 75. People who did not get Tdap at that age should get it as soon as possible. Tdap is especially important for health care professionals and anyone having close contact with a baby younger  than 12 months. Pregnant women should get a dose of Tdap during every pregnancy, to protect the newborn from pertussis. Infants are most at risk for severe, life-threatening complications from pertussis. A similar vaccine, called Td, protects from tetanus and diphtheria, but not pertussis. A Td booster should be given every 10 years. Tdap may be given as one of these boosters if you have not already gotten a dose. Tdap may also be given after a severe cut or burn to prevent tetanus infection. Your doctor can give you more information. Tdap may safely be given at the same time as other vaccines. 3. Some people should not get this vaccine  If you ever had a life-threatening allergic reaction after a dose of any tetanus, diphtheria, or pertussis containing vaccine, OR if you have a severe allergy to any part of this vaccine, you should not get Tdap. Tell your doctor if you have any severe allergies.  If you had a coma, or long or multiple seizures within 7 days after a childhood dose of DTP or DTaP, you should not get Tdap, unless a cause other than the vaccine was found. You can still get Td.  Talk to your doctor if you:  have epilepsy or another nervous system problem,  had severe pain or swelling after any vaccine containing diphtheria, tetanus or pertussis,  ever had Guillain-Barr Syndrome (GBS),  aren't feeling well on the day the shot is scheduled. 4. Risks of a vaccine reaction With any medicine, including vaccines, there is a chance of side effects. These are usually mild and go away on their own, but serious reactions are also possible. Brief fainting spells can follow a vaccination, leading to injuries from falling. Sitting or lying down for about 15 minutes can help prevent these. Tell your doctor if you feel dizzy or light-headed, or have vision changes or ringing in the ears. Mild problems following Tdap (Did not interfere with activities)  Pain where the shot was given (about 3  in 4 adolescents or 2 in 3 adults)  Redness or swelling where the shot was given (about 1 person in 5)  Mild fever of at least 100.53F (up to about 1 in 25 adolescents or 1 in 100 adults)  Headache (about 3 or 4 people in 10)  Tiredness (about 1 person in 3 or 4)  Nausea, vomiting, diarrhea, stomach ache (up to 1 in 4 adolescents or 1 in 10 adults)  Chills, body aches, sore joints, rash, swollen glands (uncommon) Moderate problems following Tdap (Interfered with activities, but did not require medical attention)  Pain where the shot was given (about 1 in 5 adolescents or 1 in 100 adults)  Redness or swelling where the shot was given (up to about 1 in 16 adolescents or 1 in 25 adults)  Fever over 102F (about 1 in 100 adolescents or 1 in 250 adults)  Headache (about 3 in 20 adolescents or 1 in 10 adults)  Nausea, vomiting, diarrhea, stomach ache (up to 1 or 3 people in 100)  Swelling of the entire arm where the shot was given (up to about 3 in 100). Severe problems following Tdap (Unable to perform usual activities; required  medical attention)  Swelling, severe pain, bleeding and redness in the arm where the shot was given (rare). A severe allergic reaction could occur after any vaccine (estimated less than 1 in a million doses). 5. What if there is a serious reaction? What should I look for?  Look for anything that concerns you, such as signs of a severe allergic reaction, very high fever, or behavior changes. Signs of a severe allergic reaction can include hives, swelling of the face and throat, difficulty breathing, a fast heartbeat, dizziness, and weakness. These would start a few minutes to a few hours after the vaccination. What should I do?  If you think it is a severe allergic reaction or other emergency that can't wait, call 9-1-1 or get the person to the nearest hospital. Otherwise, call your doctor.  Afterward, the reaction should be reported to the "Vaccine Adverse  Event Reporting System" (VAERS). Your doctor might file this report, or you can do it yourself through the VAERS web site at www.vaers.LAgents.no, or by calling 1-534-538-0714. VAERS is only for reporting reactions. They do not give medical advice.  6. The National Vaccine Injury Compensation Program The [employer] Vaccine Injury Compensation Program (VICP) is a federal program that was created to compensate people who may have been injured by certain vaccines. Persons who believe they may have been injured by a vaccine can learn about the program and about filing a claim by calling 1-531-828-0245 or visiting the VICP website at SpiritualWord.at. 7. How can I learn more?  Ask your doctor.  Call your local or state health department.  Contact the Centers for Disease Control and Prevention (CDC):  Call 786-430-8456 or visit CDC's website at PicCapture.uy. CDC Tdap Vaccine VIS (06/29/11) Document Released: 08/08/2011 Document Revised: 06/23/2013 Document Reviewed: 05/21/2013 Exit[employer] Patient Information 2015 [place], [place]. This information is not intended to replace advice given to you by your health care provider. Make sure you discuss any questions you have with your health care provider.

## 2013-10-20 NOTE — Progress Notes (Signed)
[facility] [address], [place] [address] [phone]  Date:  10/20/2013   Name:  [name]   DOB:  Jun 01, 1970   MRN:  [id number]  PCP:  Rene Paci, MD    Chief Complaint: Tetanus Shot   History of Present Illness:  [name] is a 43 y.o. very pleasant female patient who presents with the following:  Stepped on a nail Thursday that went through her flip flop.  Had a red and swollen foot that resolved with local heat.  No fever or chills. No history of foreign body No improvement with over the counter medications or other home remedies.  Denies other complaint or health concern today.   Patient Active Problem List   Diagnosis Date Noted  . DIABETES MELLITUS, TYPE II 09/29/2009  . OBSTRUCTIVE SLEEP APNEA 12/16/2008  . HYPONATREMIA, CHRONIC 12/07/2008  . HEPATITIS C 11/16/2008  . LEUKOCYTOSIS UNSPECIFIED 11/03/2008  . DEPRESSION 11/03/2008  . ALLERGIC RHINITIS 11/03/2008  . HYPERTENSION 11/02/2008  . SLEEP APNEA 11/02/2008  . RENAL CALCULUS, HX OF 11/02/2008  . BACK PAIN 10/29/2008  . TOBACCO DEPENDENCE 04/19/2006  . MIGRAINE, UNSPEC., W/O INTRACTABLE MIGRAINE 04/19/2006  . IRRITABLE BOWEL SYNDROME 04/19/2006  . HEPATITIS NOS 04/19/2006    Past Medical History  Diagnosis Date  . HEPATITIS C   . HYPONATREMIA, CHRONIC   . LEUKOCYTOSIS UNSPECIFIED     chronic s/p heme eval ([facility] 2010)  . TOBACCO DEPENDENCE   . MIGRAINE, UNSPEC., W/O INTRACTABLE MIGRAINE   . Irritable bowel syndrome   . RENAL CALCULUS, HX OF   . OSA (obstructive sleep apnea)     CPAP qhs  . DEPRESSION   . DIABETES MELLITUS, TYPE II   . ALLERGIC RHINITIS   . HYPERTENSION   . Hx MRSA infection   . CHF (congestive heart failure)     Past Surgical History  Procedure Laterality Date  . Abdominal hysterectomy  2007  . Tubal ligation  1995  . Liver biopsy  1999    History  Substance Use Topics  . Smoking status: Former Smoker -- 0.50 packs/day for  20 years    Types: Cigarettes  . Smokeless tobacco: Not on file  . Alcohol Use: No    Family History  Problem Relation Age of Onset  . Diabetes Mother   . Breast cancer Maternal Grandmother   . Alcohol abuse Other     parents  . Colon cancer Other     grandparent  . Lung cancer Other     grandparent  . Ovarian cancer Other     grandparent  . Heart disease Other     parent    Allergies  Allergen Reactions  . Erythromycin Nausea And Vomiting    Stomach upset  . Ginseng Nausea And Vomiting and Swelling  . Mushroom Extract Complex Nausea And Vomiting  . Penicillins Other (See Comments)    Childhood allergy; reaction unknown  . Pineapple Hives    unknown  . Sulfa Antibiotics Hives and Itching    itching  . Other Hives and Rash    [place]    Medication list has been reviewed and updated.  Current Outpatient Prescriptions on File Prior to Visit  Medication Sig Dispense Refill  . ibuprofen (ADVIL,MOTRIN) 200 MG tablet Take 800 mg by mouth every 6 (six) hours as needed for pain.       No current facility-administered medications on file prior to visit.    Review of Systems:  As per HPI, otherwise negative.    Physical Examination: Filed Vitals:   10/20/13 1144  BP: 114/68  Pulse: 67  Temp: 98.2 F (36.8 C)  Resp: 18   Filed Vitals:   10/20/13 1144  Height: 5' 4.5" (1.638 m)  Weight: 245 lb 3.2 oz (111.222 kg)   Body mass index is 41.45 kg/(m^2). Ideal Body Weight: Weight in (lb) to have BMI = 25: 147.6   GEN: WDWN, NAD, Non-toxic, Alert & Oriented x 3 HEENT: Atraumatic, Normocephalic.  Ears and Nose: No external deformity. EXTR: No clubbing/cyanosis/edema NEURO: Normal gait.  PSYCH: Normally interactive. Conversant. Not depressed or anxious appearing.  Calm demeanor.  Right Foot:  Cellulitis surrounding a puncture wound of foot   Assessment and Plan: Cellulitis cipro  Signed,  Phillips Odor, MD

## 2013-11-20 ENCOUNTER — Telehealth: Payer: Self-pay

## 2013-11-20 NOTE — Telephone Encounter (Signed)
Patient is having pain in her leg while walking. Checked status again about getting an antibiotic.

## 2013-11-20 NOTE — Telephone Encounter (Signed)
Lm for pt to RTC- last OV in August pt needs to be evaluated to get refill.

## 2013-11-20 NOTE — Telephone Encounter (Signed)
Pt was here a few weeks ago because she stepped on a nail.  She says it is infected again and she wants the doctor to call her in antibiotics.  [phone]

## 2013-11-20 NOTE — Telephone Encounter (Signed)
Patient calling back to check the status of an antibiotic being called in for her. I informed patient a provider has not had a chance to review her message yet. Per Patient her right foot still has not healed-she  has a knot and can barely walk. [facility] [address]. Patients call back number is [phone]

## 2013-11-21 ENCOUNTER — Ambulatory Visit (INDEPENDENT_AMBULATORY_CARE_PROVIDER_SITE_OTHER): Payer: Self-pay | Admitting: Internal Medicine

## 2013-11-21 ENCOUNTER — Ambulatory Visit (INDEPENDENT_AMBULATORY_CARE_PROVIDER_SITE_OTHER): Payer: Self-pay

## 2013-11-21 VITALS — BP 104/72 | HR 87 | Temp 98.1°F | Resp 18 | Ht 64.0 in | Wt 241.8 lb

## 2013-11-21 DIAGNOSIS — M79671 Pain in right foot: Secondary | ICD-10-CM

## 2013-11-21 DIAGNOSIS — L03119 Cellulitis of unspecified part of limb: Secondary | ICD-10-CM

## 2013-11-21 DIAGNOSIS — S91331D Puncture wound without foreign body, right foot, subsequent encounter: Secondary | ICD-10-CM

## 2013-11-21 DIAGNOSIS — L03115 Cellulitis of right lower limb: Secondary | ICD-10-CM

## 2013-11-21 DIAGNOSIS — E1165 Type 2 diabetes mellitus with hyperglycemia: Secondary | ICD-10-CM

## 2013-11-21 DIAGNOSIS — IMO0002 Reserved for concepts with insufficient information to code with codable children: Secondary | ICD-10-CM

## 2013-11-21 LAB — POCT CBC
Granulocyte percent: 57.7 %G (ref 37–80)
HCT, POC: 50 % — AB (ref 37.7–47.9)
Hemoglobin: 16.8 g/dL — AB (ref 12.2–16.2)
LYMPH, POC: 5.8 — AB (ref 0.6–3.4)
MCH: 32 pg — AB (ref 27–31.2)
MCHC: 33.6 g/dL (ref 31.8–35.4)
MCV: 95.4 fL (ref 80–97)
MID (CBC): 0.8 (ref 0–0.9)
MPV: 8.3 fL (ref 0–99.8)
PLATELET COUNT, POC: 311 10*3/uL (ref 142–424)
POC Granulocyte: 8.9 — AB (ref 2–6.9)
POC LYMPH %: 37.4 % (ref 10–50)
POC MID %: 4.9 %M (ref 0–12)
RBC: 5.24 M/uL (ref 4.04–5.48)
RDW, POC: 12.4 %
WBC: 15.5 10*3/uL — AB (ref 4.6–10.2)

## 2013-11-21 LAB — POCT GLYCOSYLATED HEMOGLOBIN (HGB A1C): Hemoglobin A1C: 12.7

## 2013-11-21 LAB — GLUCOSE, POCT (MANUAL RESULT ENTRY): POC Glucose: >=444 mg/dl (ref 70–99)

## 2013-11-21 MED ORDER — METFORMIN HCL 500 MG PO TABS: 500.0000 mg | ORAL_TABLET | Freq: Three times a day (TID) | ORAL | Status: DC

## 2013-11-21 MED ORDER — DOXYCYCLINE HYCLATE 100 MG PO TABS: 100.0000 mg | ORAL_TABLET | Freq: Two times a day (BID) | ORAL | Status: DC

## 2013-11-21 MED ORDER — CEFTRIAXONE SODIUM 1 G IJ SOLR
1.0000 g | Freq: Once | INTRAMUSCULAR | Status: AC
  Administered 2013-11-21: 1 g via INTRAMUSCULAR

## 2013-11-21 NOTE — Progress Notes (Signed)
   Subjective:    Patient ID: Cheryl PillingMelinda Herrera, female    DOB: 1970/12/28, 43 y.o.   MRN: [id number]  HPI A 43 year old female is here to be seen regarding an sore/wound on the sole of her right foot.  She is presented her with cellulitis on her right foot.   The wound was penetrated about 3/4 of an inch.  Patient has not checked her sugar level today. The last time she checked her A1c it was around 8.  Patient does not have an PCP at the moment but plans on seeking an PCP soon. Patient has not been on any Metformin for 2 years. Patient was presented with Hep C in the past; however, her Hep C has now been resolved and is considered to be non present within her body.  Review of Systems     Objective:   Physical Exam  Vitals reviewed. Constitutional: She is oriented to person, place, and time. She appears well-nourished. No distress.  HENT:  Head: Normocephalic.  Eyes: EOM are normal.  Neck: Normal range of motion. Neck supple.  Cardiovascular: Normal rate.   Pulmonary/Chest: Effort normal.  Abdominal: Soft. There is no tenderness.  Musculoskeletal: She exhibits edema and tenderness.       Right foot: She exhibits tenderness, swelling and laceration. She exhibits normal range of motion and no bony tenderness.       Feet:  Plantar nail puncture mid foot.  Neurological: She is alert and oriented to person, place, and time. She exhibits normal muscle tone. Coordination normal.  Skin: There is erythema.  Psychiatric: She has a normal mood and affect.  Cellulitis and swelling 3 cm across. Red indurated nodular area at old puncture site. Wound has healed at this time   [facility] reading (PRIMARY) by  Dr Perrin MalteseGuest no fb no fx seen Results for orders placed in visit on 11/21/13  POCT CBC      Result Value Ref Range   WBC 15.5 (*) 4.6 - 10.2 K/uL   Lymph, poc 5.8 (*) 0.6 - 3.4   POC LYMPH PERCENT 37.4  10 - 50 %L   MID (cbc) 0.8  0 - 0.9   POC MID % 4.9  0 - 12 %M   POC Granulocyte 8.9 (*) 2 - 6.9     Granulocyte percent 57.7  37 - 80 %G   RBC 5.24  4.04 - 5.48 M/uL   Hemoglobin 16.8 (*) 12.2 - 16.2 g/dL   HCT, POC 13.250.0 (*) 44.037.7 - 47.9 %   MCV 95.4  80 - 97 fL   MCH, POC 32.0 (*) 27 - 31.2 pg   MCHC 33.6  31.8 - 35.4 g/dL   RDW, POC 10.212.4     Platelet Count, POC 311  142 - 424 K/uL   MPV 8.3  0 - 99.8 fL  GLUCOSE, POCT (MANUAL RESULT ENTRY)      Result Value Ref Range   POC Glucose >=444  70 - 99 mg/dl  POCT GLYCOSYLATED HEMOGLOBIN (HGB A1C)      Result Value Ref Range   Hemoglobin A1C 12.7     Has hx chronic leukocytosis      Assessment & Plan:  Cellulitis foot/Possible FB may need referral to ortho to remove T2D uncontrolled Rocephin 1 gram/Doxycycline 100mg  bid/Warm salt water soaks and elevation  Metformin 500mg  tid/Lantus 12 units per day until glucose less than 200, then oral metformin only

## 2013-11-21 NOTE — Progress Notes (Signed)
   Subjective:    Patient ID: [name], female    DOB: [birth year], [age] y.o.   MRN: [id number]  HPI    Review of Systems     Objective:   Physical Exam        Assessment & Plan:

## 2013-11-21 NOTE — Patient Instructions (Signed)
Diabetes and Exercise Exercising regularly is important. It is not just about losing weight. It has many health benefits, such as:  Improving your overall fitness, flexibility, and endurance.  Increasing your bone density.  Helping with weight control.  Decreasing your body fat.  Increasing your muscle strength.  Reducing stress and tension.  Improving your overall health. People with diabetes who exercise gain additional benefits because exercise:  Reduces appetite.  Improves the body's use of blood sugar (glucose).  Helps lower or control blood glucose.  Decreases blood pressure.  Helps control blood lipids (such as cholesterol and triglycerides).  Improves the body's use of the hormone insulin by:  Increasing the body's insulin sensitivity.  Reducing the body's insulin needs.  Decreases the risk for heart disease because exercising:  Lowers cholesterol and triglycerides levels.  Increases the levels of good cholesterol (such as high-density lipoproteins [HDL]) in the body.  Lowers blood glucose levels. YOUR ACTIVITY PLAN  Choose an activity that you enjoy and set realistic goals. Your health care provider or diabetes educator can help you make an activity plan that works for you. Exercise regularly as directed by your health care provider. This includes:  Performing resistance training twice a week such as push-ups, sit-ups, lifting weights, or using resistance bands.  Performing 150 minutes of cardio exercises each week such as walking, running, or playing sports.  Staying active and spending no more than 90 minutes at one time being inactive. Even short bursts of exercise are good for you. Three 10-minute sessions spread throughout the day are just as beneficial as a single 30-minute session. Some exercise ideas include:  Taking the dog for a walk.  Taking the stairs instead of the elevator.  Dancing to your favorite song.  Doing an exercise  video.  Doing your favorite exercise with a friend. RECOMMENDATIONS FOR EXERCISING WITH TYPE 1 OR TYPE 2 DIABETES   Check your blood glucose before exercising. If blood glucose levels are greater than 240 mg/dL, check for urine ketones. Do not exercise if ketones are present.  Avoid injecting insulin into areas of the body that are going to be exercised. For example, avoid injecting insulin into:  The arms when playing tennis.  The legs when jogging.  Keep a record of:  Food intake before and after you exercise.  Expected peak times of insulin action.  Blood glucose levels before and after you exercise.  The type and amount of exercise you have done.  Review your records with your health care provider. Your health care provider will help you to develop guidelines for adjusting food intake and insulin amounts before and after exercising.  If you take insulin or oral hypoglycemic agents, watch for signs and symptoms of hypoglycemia. They include:  Dizziness.  Shaking.  Sweating.  Chills.  Confusion.  Drink plenty of water while you exercise to prevent dehydration or heat stroke. Body water is lost during exercise and must be replaced.  Talk to your health care provider before starting an exercise program to make sure it is safe for you. Remember, almost any type of activity is better than none. Document Released: 04/29/2003 Document Revised: 06/23/2013 Document Reviewed: 07/16/2012 ExitCare Patient Information 2015 [employer]. This information is not intended to replace advice given to you by your health care provider. Make sure you discuss any questions you have with your health care provider. Cellulitis Cellulitis is an infection of the skin and the tissue beneath it. The infected area is usually red and tender.   Cellulitis occurs most often in the arms and lower legs.  CAUSES  Cellulitis is caused by bacteria that enter the skin through cracks or cuts in the skin.  The most common types of bacteria that cause cellulitis are staphylococci and streptococci. SIGNS AND SYMPTOMS   Redness and warmth.  Swelling.  Tenderness or pain.  Fever. DIAGNOSIS  Your health care provider can usually determine what is wrong based on a physical exam. Blood tests may also be done. TREATMENT  Treatment usually involves taking an antibiotic medicine. HOME CARE INSTRUCTIONS   Take your antibiotic medicine as directed by your health care provider. Finish the antibiotic even if you start to feel better.  Keep the infected arm or leg elevated to reduce swelling.  Apply a warm cloth to the affected area up to 4 times per day to relieve pain.  Take medicines only as directed by your health care provider.  Keep all follow-up visits as directed by your health care provider. SEEK MEDICAL CARE IF:   You notice red streaks coming from the infected area.  Your red area gets larger or turns dark in color.  Your bone or joint underneath the infected area becomes painful after the skin has healed.  Your infection returns in the same area or another area.  You notice a swollen bump in the infected area.  You develop new symptoms.  You have a fever. SEEK IMMEDIATE MEDICAL CARE IF:   You feel very sleepy.  You develop vomiting or diarrhea.  You have a general ill feeling (malaise) with muscle aches and pains. MAKE SURE YOU:   Understand these instructions.  Will watch your condition.  Will get help right away if you are not doing well or get worse. Document Released: 11/16/2004 Document Revised: 06/23/2013 Document Reviewed: 04/24/2011 Exit[employer] Patient Information 2015 [employer]. This information is not intended to replace advice given to you by your health care provider. Make sure you discuss any questions you have with your health care provider.  

## 2013-11-23 ENCOUNTER — Ambulatory Visit (INDEPENDENT_AMBULATORY_CARE_PROVIDER_SITE_OTHER): Payer: Self-pay | Admitting: Internal Medicine

## 2013-11-23 VITALS — BP 104/80 | HR 80 | Temp 97.9°F | Resp 16 | Ht 65.0 in | Wt 242.0 lb

## 2013-11-23 DIAGNOSIS — IMO0002 Reserved for concepts with insufficient information to code with codable children: Secondary | ICD-10-CM

## 2013-11-23 DIAGNOSIS — L03119 Cellulitis of unspecified part of limb: Secondary | ICD-10-CM

## 2013-11-23 DIAGNOSIS — E1165 Type 2 diabetes mellitus with hyperglycemia: Secondary | ICD-10-CM

## 2013-11-23 NOTE — Progress Notes (Signed)
Subjective:    Patient ID: [name], female    DOB: [birth year], 43 y.o.   MRN: [id number] This chart was scribed for Estalene Bergey P. Merla Riches, MD by Chestine Spore, ED Scribe. The patient was seen in room 9 at 11:35 AM.    Chief Complaint  Patient presents with  . Follow-up    recheck of right foot wound; pt states it is getting a little better    HPI [name] is a 43 y.o. female with a medical hx of Type II DM, HTN, and Hepatitis C who presents today complaining of F/U on right foot puncture wound onset 1.5 month ago. She states that the nail was a 1.5 inch nail and was rusty. She states that the nail went through her flip flop and went 3/4 of the way into her foot. She states that it was a ball on the bottom of her foot at first. cipro in august and improved She states that she was given Doxycycline by Dr. Perrin Maltese on 10/1 and DM found to be uncontrolled-started lantus and metf. She states that she checks her blood sugar at home and this morning it was 352. She denies any other associated symptoms.  Patient Active Problem List   Diagnosis Date Noted  . DIABETES MELLITUS, TYPE II 09/29/2009  . OBSTRUCTIVE SLEEP APNEA 12/16/2008  . HYPONATREMIA, CHRONIC 12/07/2008  . HEPATITIS C 11/16/2008  . LEUKOCYTOSIS UNSPECIFIED 11/03/2008  . DEPRESSION 11/03/2008  . ALLERGIC RHINITIS 11/03/2008  . HYPERTENSION 11/02/2008  . SLEEP APNEA 11/02/2008  . RENAL CALCULUS, HX OF 11/02/2008  . BACK PAIN 10/29/2008  . TOBACCO DEPENDENCE 04/19/2006  . MIGRAINE, UNSPEC., W/O INTRACTABLE MIGRAINE 04/19/2006  . IRRITABLE BOWEL SYNDROME 04/19/2006  . HEPATITIS NOS 04/19/2006   Past Medical History  Diagnosis Date  . HEPATITIS C   . HYPONATREMIA, CHRONIC   . LEUKOCYTOSIS UNSPECIFIED     chronic s/p heme eval ([facility] 2010)  . TOBACCO DEPENDENCE   . MIGRAINE, UNSPEC., W/O INTRACTABLE MIGRAINE   . Irritable bowel syndrome   . RENAL CALCULUS, HX OF   . OSA (obstructive sleep apnea)     CPAP qhs  .  DEPRESSION   . DIABETES MELLITUS, TYPE II   . ALLERGIC RHINITIS   . HYPERTENSION   . Hx MRSA infection   . CHF (congestive heart failure)   . Puncture wound of right foot     penetrated 3/4 of an inch   Past Surgical History  Procedure Laterality Date  . Abdominal hysterectomy  2007  . Tubal ligation  1995  . Liver biopsy  1999   Allergies  Allergen Reactions  . Erythromycin Nausea And Vomiting    Stomach upset  . Ginseng Nausea And Vomiting and Swelling  . Mushroom Extract Complex Nausea And Vomiting  . Penicillins Other (See Comments)    Childhood allergy; reaction unknown  . Pineapple Hives    unknown  . Sulfa Antibiotics Hives and Itching    itching  . Other Hives and Rash    [place]   Prior to Admission medications   Medication Sig Start Date End Date Taking? Authorizing Provider  doxycycline (VIBRA-TABS) 100 MG tablet Take 1 tablet (100 mg total) by mouth 2 (two) times daily. 11/21/13  Yes Jonita Albee, MD  ibuprofen (ADVIL,MOTRIN) 200 MG tablet Take 800 mg by mouth every 6 (six) hours as needed for pain.   Yes Historical Provider, MD  metFORMIN (GLUCOPHAGE) 500 MG tablet Take 1 tablet (500 mg total) by  mouth 3 (three) times daily. 11/21/13  Yes Jonita Albeehris W Guest, MD     Review of Systems  Constitutional: Negative for fever and chills.  HENT: Negative for dental problem.   Eyes: Negative for pain.  Respiratory: Negative for cough.   Skin: Positive for wound (to the bottom of the right foot).       Objective:   Physical Exam  Nursing note and vitals reviewed. Constitutional: She is oriented to person, place, and time. She appears well-developed and well-nourished. No distress.  HENT:  Head: Normocephalic and atraumatic.  Eyes: EOM are normal.  Neck: Neck supple. No tracheal deviation present.  Cardiovascular: Normal rate.   Pulmonary/Chest: Effort normal. No respiratory distress.  Musculoskeletal: Normal range of motion.  Neurological: She is alert and  oriented to person, place, and time.  Skin: Skin is warm and dry.  On the plantar surface of the right foot is a 1 x 2 cm tender, red, papular, lesion with central punctum.   Psychiatric: She has a normal mood and affect. Her behavior is normal.    Procedure-after 2 cc 2% lidocaine a 2 cm opening is created from the central punctum and blood and pus expressed until a .4 centimeter fragment of rubber is extracted     BP 104/80  Pulse 80  Temp(Src) 97.9 F (36.6 C) (Oral)  Resp 16  Ht 5\' 5"  (1.651 m)  Wt 242 lb (109.77 kg)  BMI 40.27 kg/m2  SpO2 98%  Assessment & Plan:  COORDINATION OF CARE: 11:38 AM-Discussed treatment plan with pt at bedside and pt agreed to plan.   I personally performed the services described in this documentation, which was scribed in my presence. The recorded information has been reviewed and is accurate.   Cellulitis of foot secondary to retained foreign body  Continue doxycycline and hot soaks until culture results Diabetes uncontrolled  incr lantus to 20u daily and cont w/ home monitor

## 2013-11-23 NOTE — Addendum Note (Signed)
Addended by: Laurey ArrowGREER, Timothy Townsel M on: 11/23/2013 12:15 PM   Modules accepted: Orders

## 2013-11-23 NOTE — Patient Instructions (Signed)
Please call the clinic below to get an appointment asap.  They are a clinic set up by [facility] to care for people without health insurance so may be able to get you care at much cheaper cost with a large discount on labs, imaging, and medication. [facility] [address] [place], [place] [address] Hours of Operation Mon - Fri: 9 a.m. - 6 p.m. Main: [phone] The orange card program is sponsored by the [facility]. Call [phone] to see if this might be something for which you are eligible. This can ensure you have access to the medical and dental services that  and [place] help provide to people without health insurance. 

## 2013-11-26 LAB — WOUND CULTURE
GRAM STAIN: NONE SEEN
Gram Stain: NONE SEEN
Gram Stain: NONE SEEN
ORGANISM ID, BACTERIA: NO GROWTH

## 2013-12-02 NOTE — Progress Notes (Signed)
She may need the wound reopened and will certainly need another type of antibiotic She should recheck

## 2013-12-04 ENCOUNTER — Telehealth: Payer: Self-pay

## 2013-12-04 NOTE — Telephone Encounter (Signed)
Per patient she was called a couple days ago in regards to her lab results. Patient stated the person that called her inquired about her right foot-if it was still infected. Per patient she told them yes and she hasn't heard anything back from our office. Patient not sure if she has to come back in or if we could call in medication. [facility] call back number [phone].

## 2013-12-04 NOTE — Telephone Encounter (Signed)
Pt called and states her foot is not worse but no better, she was told the antibiotic she was given may not be strong enough and another would need o be given to her. She does not have the money for another OV, but would like to have the stronger antibiotic sent to her pharm. Please advise

## 2013-12-04 NOTE — Telephone Encounter (Signed)
Called patient if she is still having problems, she should return to clinic. She indicates her blood sugars are between 230 and 250, she states she has not been soaking her foot. She states she did not use the insulin yesterday, took Metformin instead. I am concerned about her readings and her not using the insulin. She indicates she will return to clinic today.

## 2013-12-05 NOTE — Telephone Encounter (Signed)
Pt states she is doing much better. She is taking her medications as prescribed. The area is healing. She was concerned about a little brusing around the area where it was cut into. No temp, no redness, no drainage. Advised her RTC precautions.   She has tried to get insurance numerous times. She is going early next week to try again. She is waiting for her friend that has experience dealing with "the system" that is going to take her.

## 2013-12-05 NOTE — Telephone Encounter (Signed)
She needs re-evaluation. OK to do payment agreement. She needs to be taking ALL her medications as prescribed, not just some of them. When she was here, Dr. Merla Richesoolittle gave her some information about the [facility] Financial Assistance program and the [facility].  Has she contacted either of those numbers?

## 2014-04-09 ENCOUNTER — Ambulatory Visit: Payer: Self-pay

## 2014-04-09 ENCOUNTER — Ambulatory Visit (INDEPENDENT_AMBULATORY_CARE_PROVIDER_SITE_OTHER): Payer: Self-pay | Admitting: Physician Assistant

## 2014-04-09 VITALS — BP 110/70 | HR 93 | Temp 97.9°F | Resp 18 | Ht 64.0 in | Wt 218.0 lb

## 2014-04-09 DIAGNOSIS — R5383 Other fatigue: Secondary | ICD-10-CM

## 2014-04-09 DIAGNOSIS — E1165 Type 2 diabetes mellitus with hyperglycemia: Secondary | ICD-10-CM

## 2014-04-09 DIAGNOSIS — E119 Type 2 diabetes mellitus without complications: Secondary | ICD-10-CM

## 2014-04-09 LAB — TSH: TSH: 0.389 u[IU]/mL (ref 0.350–4.500)

## 2014-04-09 LAB — COMPLETE METABOLIC PANEL WITH GFR
ALT: 18 U/L (ref 0–35)
AST: 15 U/L (ref 0–37)
Albumin: 3.8 g/dL (ref 3.5–5.2)
Alkaline Phosphatase: 66 U/L (ref 39–117)
BUN: 28 mg/dL — AB (ref 6–23)
CHLORIDE: 105 meq/L (ref 96–112)
CO2: 23 meq/L (ref 19–32)
CREATININE: 0.78 mg/dL (ref 0.50–1.10)
Calcium: 9.8 mg/dL (ref 8.4–10.5)
GFR, Est African American: >89 mL/min
GFR, Est Non African American: >89 mL/min
Glucose, Bld: 131 mg/dL — ABNORMAL HIGH (ref 70–99)
Potassium: 4.3 mEq/L (ref 3.5–5.3)
Sodium: 137 mEq/L (ref 135–145)
Total Bilirubin: 0.4 mg/dL (ref 0.2–1.2)
Total Protein: 7.2 g/dL (ref 6.0–8.3)

## 2014-04-09 LAB — GLUCOSE, POCT (MANUAL RESULT ENTRY): POC GLUCOSE: 140 mg/dL — AB (ref 70–99)

## 2014-04-09 LAB — POCT GLYCOSYLATED HEMOGLOBIN (HGB A1C): Hemoglobin A1C: 5.9

## 2014-04-09 MED ORDER — METFORMIN HCL 500 MG PO TABS: 500.0000 mg | ORAL_TABLET | Freq: Every day | ORAL | Status: DC

## 2014-04-09 NOTE — Patient Instructions (Signed)
GREAT JOB!!!  Diabetes and Exercise Exercising regularly is important. It is not just about losing weight. It has many health benefits, such as:  Improving your overall fitness, flexibility, and endurance.  Increasing your bone density.  Helping with weight control.  Decreasing your body fat.  Increasing your muscle strength.  Reducing stress and tension.  Improving your overall health. People with diabetes who exercise gain additional benefits because exercise:  Reduces appetite.  Improves the body's use of blood sugar (glucose).  Helps lower or control blood glucose.  Decreases blood pressure.  Helps control blood lipids (such as cholesterol and triglycerides).  Improves the body's use of the hormone insulin by:  Increasing the body's insulin sensitivity.  Reducing the body's insulin needs.  Decreases the risk for heart disease because exercising:  Lowers cholesterol and triglycerides levels.  Increases the levels of good cholesterol (such as high-density lipoproteins [HDL]) in the body.  Lowers blood glucose levels. YOUR ACTIVITY PLAN  Choose an activity that you enjoy and set realistic goals. Your health care provider or diabetes educator can help you make an activity plan that works for you. Exercise regularly as directed by your health care provider. This includes:  Performing resistance training twice a week such as push-ups, sit-ups, lifting weights, or using resistance bands.  Performing 150 minutes of cardio exercises each week such as walking, running, or playing sports.  Staying active and spending no more than 90 minutes at one time being inactive. Even short bursts of exercise are good for you. Three 10-minute sessions spread throughout the day are just as beneficial as a single 30-minute session. Some exercise ideas include:  Taking the dog for a walk.  Taking the stairs instead of the elevator.  Dancing to your favorite song.  Doing an  exercise video.  Doing your favorite exercise with a friend. RECOMMENDATIONS FOR EXERCISING WITH TYPE 1 OR TYPE 2 DIABETES   Check your blood glucose before exercising. If blood glucose levels are greater than 240 mg/dL, check for urine ketones. Do not exercise if ketones are present.  Avoid injecting insulin into areas of the body that are going to be exercised. For example, avoid injecting insulin into:  The arms when playing tennis.  The legs when jogging.  Keep a record of:  Food intake before and after you exercise.  Expected peak times of insulin action.  Blood glucose levels before and after you exercise.  The type and amount of exercise you have done.  Review your records with your health care provider. Your health care provider will help you to develop guidelines for adjusting food intake and insulin amounts before and after exercising.  If you take insulin or oral hypoglycemic agents, watch for signs and symptoms of hypoglycemia. They include:  Dizziness.  Shaking.  Sweating.  Chills.  Confusion.  Drink plenty of water while you exercise to prevent dehydration or heat stroke. Body water is lost during exercise and must be replaced.  Talk to your health care provider before starting an exercise program to make sure it is safe for you. Remember, almost any type of activity is better than none. Document Released: 04/29/2003 Document Revised: 06/23/2013 Document Reviewed: 07/16/2012 [facility] Patient Information 2015 [employer] [place]. This information is not intended to replace advice given to you by your health care provider. Make sure you discuss any questions you have with your health care provider.

## 2014-04-09 NOTE — Progress Notes (Signed)
Subjective:    Patient ID: [name], female    DOB: [birth year], 44 y.o.   MRN: [id number]  HPI Patient presents for diabetes check. States that she takes metformin at the least once daily, but sometimes gets up to 3 times daily. Med well tolerated. Home glucose ranges from 140s-170s. Has lost 25 lbs by monitoring her diet and not eating anything white and red. Is also cooking more at home and keeping healthy snacks like fruits and kale near. Not eating out much. 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 Denies SOB/CP. Has edema at bottom of feel that is not new.   Noticed has been more tired over past few months. Gets 7 hours of sleep most nights. Uses CPAP every night and with naps and feels well rested when she wakes. Work hours vary due to nature of job. Does not have menstrual cycles so no excessive bleeding. No family h/o thyroid dz.   Review of Systems  Constitutional: Positive for fatigue. Negative for activity change and appetite change.  HENT: Positive for congestion.   Respiratory: Negative for shortness of breath.   Cardiovascular: Negative for chest pain, palpitations and leg swelling.  Gastrointestinal: Negative for nausea and vomiting.  Endocrine: Negative for cold intolerance, heat intolerance, polydipsia, polyphagia and polyuria.  Genitourinary: Negative for frequency.  Neurological: Negative for dizziness, light-headedness and headaches.       Objective:   Physical Exam  Constitutional: She is oriented to person, place, and time.  Blood pressure 110/70, pulse 93, temperature 97.9 F (36.6 C), temperature source Oral, resp. rate 18, height  (1.626 m), weight 218 lb (98.884 kg), SpO2 98 %.  HENT:  Head: Normocephalic and atraumatic.  Right Ear: External ear normal.  Left Ear: External ear normal.  Eyes: Pupils are equal, round, and reactive to light. Right eye exhibits no discharge.  Left eye exhibits no discharge. No scleral icterus.  Neck: Neck supple. No thyromegaly present.  Cardiovascular: Normal rate, regular rhythm and normal heart sounds.  Exam reveals no gallop and no friction rub.   No murmur heard. Pulmonary/Chest: Effort normal and breath sounds normal. No respiratory distress. She has no wheezes. She has no rales.  Abdominal: Soft. Bowel sounds are normal.  Lymphadenopathy:    She has no cervical adenopathy.  Neurological: She is alert and oriented to person, place, and time.  Skin: Skin is warm and dry. No rash noted. No erythema. No pallor.    Wt Readings from Last 3 Encounters:  04/09/14 218 lb (98.884 kg)  11/23/13 242 lb (109.77 kg)  11/21/13 241 lb 12.8 oz (109.68 kg)   Results for orders placed or performed in visit on 04/09/14  POCT glycosylated hemoglobin (Hb A1C)  Result Value Ref Range   Hemoglobin A1C 5.9   POCT glucose (manual entry)  Result Value Ref Range   POC Glucose 140 (A) 70 - 99 mg/dl      Assessment & Plan:  1. Type 2 diabetes mellitus without complication Diabetes much improved. From HbA1C of 12.7 to 5.9. Decreased metformin. Continue eating patterns as the have given great results. Add exercise to regimen.  - POCT glycosylated hemoglobin (Hb A1C) - POCT glucose (manual entry) - COMPLETE METABOLIC PANEL WITH GFR - Microalbumin, urine - metFORMIN (GLUCOPHAGE) 500 MG tablet; Take 1 tablet (500 mg total) by mouth daily with breakfast.  Dispense: 30 tablet; Refill: 3  2. Other fatigue - TSH  Janan Ridgeishira Rafaella Kole PA-C  [facility] 04/09/2014 8:03 PM

## 2014-04-10 LAB — MICROALBUMIN, URINE: MICROALB UR: 1.3 mg/dL (ref <2.0)

## 2014-04-10 NOTE — Progress Notes (Signed)
  Medical screening examination/treatment/procedure(s) were performed by non-physician practitioner and as supervising physician I was immediately available for consultation/collaboration.     

## 2014-04-13 ENCOUNTER — Encounter: Payer: Self-pay | Admitting: Physician Assistant

## 2014-05-31 ENCOUNTER — Ambulatory Visit (INDEPENDENT_AMBULATORY_CARE_PROVIDER_SITE_OTHER): Payer: Self-pay | Admitting: Family Medicine

## 2014-05-31 ENCOUNTER — Ambulatory Visit (INDEPENDENT_AMBULATORY_CARE_PROVIDER_SITE_OTHER): Payer: Self-pay

## 2014-05-31 VITALS — BP 124/74 | HR 88 | Temp 98.1°F | Resp 20 | Ht 64.0 in | Wt 219.4 lb

## 2014-05-31 DIAGNOSIS — M79645 Pain in left finger(s): Secondary | ICD-10-CM

## 2014-05-31 DIAGNOSIS — L03012 Cellulitis of left finger: Secondary | ICD-10-CM

## 2014-05-31 MED ORDER — DOXYCYCLINE HYCLATE 100 MG PO CAPS
100.0000 mg | ORAL_CAPSULE | Freq: Two times a day (BID) | ORAL | Status: AC
Start: 2014-05-31 — End: 2014-06-10

## 2014-05-31 NOTE — Progress Notes (Signed)
Subjective:    Patient ID: [name], female    DOB: Mar 07, 1970, 44 y.o.   MRN: [id number]  HPI  Alexianna Nachreiner is a 44 year old female who presents today for evaluation of left index finger.  She smashed her left index finger 2 weeks ago from a crush injury. Subsequently, she dropped a 55 pound bag of dog food on the same index finger 5 days ago, which caused her finger to swell and the nail to turn a yellow-green color. 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 The area formed pus about 3 days ago, after the finger was wrenched, but the nail has been hurting since the initial insult. The nail has not fallen off, and did not bruise significantly. She initially used super-glue to keep the nail intact due to concern that it would eventually fall off. She then further soaked her nail in acetone to remove the super-glue. She has taken 800 mg Ibuprofen every 8 hours since yesterday (05/30/14) for pain. The finger is throbbing and is painful to touch, especially along the medial and lateral aspects of finger. Still has full range of motion in left index finger. Denies any pain or further abnormal sesnation to the ventral aspect of left index finger. Denies any fever, chills, nausea, or vomiting.  She states that she has abnormal sensation in her left index finger due to an injury in 2004 from a jar of cat food. The jar sliced open her finger along the ventral aspect of left index finger, which required surgery, and as a result she says the nerves no longer "function normally" in that finger.   She does have seasonal allergies and has had postnasal drip, sinus pressure, and itchy eyes since November 2015.   Review of Systems  Constitutional: Negative for fever, chills and diaphoresis.  HENT: Positive for postnasal drip and sinus pressure. Negative for sore throat.   Eyes: Positive for itching.  Respiratory:  Negative for cough and shortness of breath.   Cardiovascular: Negative for chest pain and palpitations.  Gastrointestinal: Negative for nausea and vomiting.       Objective:   Physical Exam  Constitutional: She is oriented to person, place, and time. She appears well-developed and well-nourished. No distress.  BP 124/74 mmHg  Pulse 88  Temp(Src) 98.1 F (36.7 C) (Oral)  Resp 20  Ht  (1.626 m)  Wt 219 lb 6 oz (99.508 kg)  BMI 37.64 kg/m2  SpO2 98%  HENT:  Head: Normocephalic and atraumatic.  Neck: Neck supple.  Cardiovascular: Normal rate, regular rhythm and normal heart sounds.   No murmur heard. Pulmonary/Chest: Effort normal and breath sounds normal.  Lymphadenopathy:    She has no cervical adenopathy.  Neurological: She is alert and oriented to person, place, and time.  Skin: Skin is warm and dry.     Psychiatric: She has a normal mood and affect.   Left index finger insepction: nail is yellow-green in color. Fluctuance present surrounding both sides of nail bed; more significant on medial aspect. Erythema to distal aspect of phalange. Left index finger x-ray: no acute findings; no bone spurs or fracture present.  Procedure: Verbal consent obtained from patient. Metacarpal block with 4cc of 2% Xylocaine injected. Sterile prep and drape. One half centimeter curvilinear incision made to medial aspect of left index finger on the dorsal side. Patient experienced local pain so  additional 1cc of 2% Xylocaine was injected into the incision area. Pus drained from incision. Wound culture obtained and sent to lab. Incision packed with a tiny amount of one quarter inch plain packing and dressed. Patient instructed in proper wound care.     Assessment & Plan:  1. Paronychia of left index finger; left finger pain -X-ray of left index finger obtained.  -I&D left index finger: incision on the medial aspect of left index finger. Incision packed. -Wound culture  obtained. -Prescription for Doxycycline 100 mg capsule by mouth twice daily for 10 days.  -Patient instructed in proper wound care. -RTC in 2 days for unpacking of incision and to ensure proper healing.

## 2014-05-31 NOTE — Patient Instructions (Addendum)
Take the antibiotic as prescribed. Apply a warm compress to the area for 15-20 minutes 2-4 times each day. Change the dressing if it becomes saturated, leaks or falls off.  

## 2014-05-31 NOTE — Progress Notes (Signed)
Patient ID: [name], female    DOB: 08-12-1970, 44 y.o.   MRN: [id number]  PCP: Rene Paci, MD  Subjective:   Chief Complaint  Patient presents with  . Finger Injury    Left Pointer Finger    HPI  [name] is a 44 year old female who presents today for evaluation of left index finger.   2 weeks ago she sustained a crush injury when she slammed her finger in a door. Subsequently, she dropped a 55 pound bag of dog food on the same index finger 5 days ago, which caused her finger to swell and the nail to turn a yellow-green color. 3 days ago she wrenched her finger further in the process of restraining a person.   The area formed pus about 3 days ago, after the finger was wrenched, but the nail has been hurting since the initial insult. The nail has not fallen off, and did not bruise significantly. She initially used super-glue to keep the nail intact due to concern that it would eventually fall off. She then further soaked her nail in acetone to remove the super-glue. She has taken 800 mg Ibuprofen every 8 hours since yesterday (05/30/14) for pain. The finger is throbbing and is painful to touch, especially along the medial and lateral aspects of finger. Still has full range of motion in left index finger. Denies any pain or further abnormal sesnation to the ventral aspect of left index finger since the injuries, but notes altered sensation is baseline since a surgical repair of a remote injury. She states that she has abnormal sensation in her left index finger due to an injury in 2004 from a jar of cat food. The jar sliced open her finger along the ventral aspect of left index finger, which required surgery, and as a result she says the nerves no longer "function normally" in that finger.   Denies any fever, chills, nausea, or vomiting.   She does have seasonal allergies and has had postnasal drip, sinus pressure, and itchy eyes since November 2015.     Review of  Systems Review of Systems As above.    Patient Active Problem List   Diagnosis Date Noted  . DIABETES MELLITUS, TYPE II 09/29/2009  . OBSTRUCTIVE SLEEP APNEA 12/16/2008  . HYPONATREMIA, CHRONIC 12/07/2008  . HEPATITIS C 11/16/2008  . LEUKOCYTOSIS UNSPECIFIED 11/03/2008  . DEPRESSION 11/03/2008  . ALLERGIC RHINITIS 11/03/2008  . HYPERTENSION 11/02/2008  . SLEEP APNEA 11/02/2008  . RENAL CALCULUS, HX OF 11/02/2008  . BACK PAIN 10/29/2008  . TOBACCO DEPENDENCE 04/19/2006  . MIGRAINE, UNSPEC., W/O INTRACTABLE MIGRAINE 04/19/2006  . IRRITABLE BOWEL SYNDROME 04/19/2006  . HEPATITIS NOS 04/19/2006     Prior to Admission medications   Medication Sig Start Date End Date Taking? Authorizing Provider  ibuprofen (ADVIL,MOTRIN) 200 MG tablet Take 800 mg by mouth every 6 (six) hours as needed for pain.   Yes Historical Provider, MD  metFORMIN (GLUCOPHAGE) 500 MG tablet Take 1 tablet (500 mg total) by mouth daily with breakfast. 04/09/14  Yes Tishira R Brewington, PA-C  doxycycline (VIBRAMYCIN) 100 MG capsule Take 1 capsule (100 mg total) by mouth 2 (two) times daily. 05/31/14 06/10/14  Tamirra Sienkiewicz Tessa Lerner, PA-C     Allergies  Allergen Reactions  . Erythromycin Nausea And Vomiting    Stomach upset  . Ginseng Nausea And Vomiting and Swelling  . Mushroom Extract Complex Nausea And Vomiting  . Pineapple Hives    unknown  . Sulfa  Antibiotics Hives and Itching    itching  . Other Hives and Rash    [place]       Objective:  Physical Exam  Physical Exam  Constitutional: She is oriented to person, place, and time. She appears well-developed and well-nourished. She is active and cooperative. No distress.  BP 124/74 mmHg  Pulse 88  Temp(Src) 98.1 F (36.7 C) (Oral)  Resp 20  Ht 5\' 4"  (1.626 m)  Wt 219 lb 6 oz (99.508 kg)  BMI 37.64 kg/m2  SpO2 98%   Eyes: Conjunctivae are normal.  Pulmonary/Chest: Effort normal.  Neurological: She is alert and oriented to person, place, and time.   Skin: Skin is warm and dry.  Distal aspect of the LEFT index finger is swollen and erythematous. Abscess pocket visible under the nail. Fluctuant area extends to the ulnar aspect of the finger.  Psychiatric: She has a normal mood and affect. Her speech is normal and behavior is normal.   LEFT index finger: [facility] reading (PRIMARY) by  Dr. Clelia CroftShaw. No fracture.  Procedure: Performed by Carren Ranganielle Carter, PA-S with my direct observation and instruction. Verbal Consent Obtained. Anesthesia by metacarpal block with 4 cc of 2% lidocaine plain.  15 blade used to incise the lesion centrally.  Purulence expressed. Additional local anesthesia with 1 cc of 2% lidocaine and packed with a scant length of 1/4 inch plain packing.  Cleansed and dressed.  Initially, I planned to remove the nail to evacuate the abscess, but understood the patient did not desire nail removal. Therefore, the abscess was accessed from the side, with good results.  As the patient was checking out, she advised me that she really wanted the nail removed.  She was returned to the exam room for the second procedure.  Verbal Consent Obtained. Digital Block with 2.5cc 2% Lidocaine plain. Sterile Prep and Drape. Entire nail lifted and excised. No additional purulence expressed. Xeroform placed. Cleansed and dressed.            Assessment & Plan:  1. Finger pain, left - DG Finger Index Left; Future  2. Paronychia of finger, left Local wound care. RTC 2 days for re-evaluation. - DG Finger Index Left; Future - doxycycline (VIBRAMYCIN) 100 MG capsule; Take 1 capsule (100 mg total) by mouth 2 (two) times daily.  Dispense: 20 capsule; Refill: 0 - Wound culture   Fernande Brashelle S. Ennifer Harston, PA-C Physician Assistant-Certified [facility]

## 2014-06-02 ENCOUNTER — Ambulatory Visit (INDEPENDENT_AMBULATORY_CARE_PROVIDER_SITE_OTHER): Payer: Self-pay | Admitting: Physician Assistant

## 2014-06-02 VITALS — BP 120/72 | HR 77 | Temp 98.0°F | Resp 16 | Ht 64.0 in | Wt 219.0 lb

## 2014-06-02 DIAGNOSIS — L03012 Cellulitis of left finger: Secondary | ICD-10-CM

## 2014-06-02 NOTE — Progress Notes (Signed)
  Medical screening examination/treatment/procedure(s) were performed by non-physician practitioner and as supervising physician I was immediately available for consultation/collaboration.     

## 2014-06-02 NOTE — Progress Notes (Signed)
Subjective:    Patient ID: [name], female    DOB: [birth year], 44 y.o.   MRN: [id number]  HPI  This is a 44 year old female following up for wound care . 2 days ago she had I&D and removal of left 2nd digit fingernail d/t paronychia. She had several crush injuries to finger over preceding 3 weeks. Radiograph negative for bony abnormality. She has kept wound dressed since procedure. She reports the finger is feeling much better with less pain. She denies fever or chills.  Review of Systems  Constitutional: Negative for fever and chills.  Gastrointestinal: Negative for nausea, vomiting and abdominal pain.  Musculoskeletal: Positive for joint swelling.  Skin: Positive for color change and wound.  Neurological: Negative for numbness.    Patient Active Problem List   Diagnosis Date Noted  . DIABETES MELLITUS, TYPE II 09/29/2009  . OBSTRUCTIVE SLEEP APNEA 12/16/2008  . HYPONATREMIA, CHRONIC 12/07/2008  . HEPATITIS C 11/16/2008  . LEUKOCYTOSIS UNSPECIFIED 11/03/2008  . DEPRESSION 11/03/2008  . ALLERGIC RHINITIS 11/03/2008  . HYPERTENSION 11/02/2008  . SLEEP APNEA 11/02/2008  . RENAL CALCULUS, HX OF 11/02/2008  . BACK PAIN 10/29/2008  . TOBACCO DEPENDENCE 04/19/2006  . MIGRAINE, UNSPEC., W/O INTRACTABLE MIGRAINE 04/19/2006  . IRRITABLE BOWEL SYNDROME 04/19/2006  . HEPATITIS NOS 04/19/2006   Prior to Admission medications   Medication Sig Start Date End Date Taking? Authorizing Provider  doxycycline (VIBRAMYCIN) 100 MG capsule Take 1 capsule (100 mg total) by mouth 2 (two) times daily. 05/31/14 06/10/14 Yes Chelle S Jeffery, PA-C  ibuprofen (ADVIL,MOTRIN) 200 MG tablet Take 800 mg by mouth every 6 (six) hours as needed for pain.   Yes Historical Provider, MD  metFORMIN (GLUCOPHAGE) 500 MG tablet Take 1 tablet (500 mg total) by mouth daily with breakfast. 04/09/14  Yes Tishira R Brewington, PA-C   Allergies  Allergen Reactions  . Erythromycin Nausea And Vomiting    Stomach  upset  . Ginseng Nausea And Vomiting and Swelling  . Mushroom Extract Complex Nausea And Vomiting  . Pineapple Hives    unknown  . Sulfa Antibiotics Hives and Itching    itching  . Other Hives and Rash    [place]   Patient's social and family history were reviewed.     Objective:   Physical Exam  Constitutional: She is oriented to person, place, and time. She appears well-developed and well-nourished. No distress.  HENT:  Head: Normocephalic and atraumatic.  Right Ear: Hearing normal.  Left Ear: Hearing normal.  Nose: Nose normal.  Eyes: Conjunctivae and lids are normal. Right eye exhibits no discharge. Left eye exhibits no discharge. No scleral icterus.  Pulmonary/Chest: Effort normal. No respiratory distress.  Musculoskeletal: Normal range of motion.  Neurological: She is alert and oriented to person, place, and time.  Skin: Skin is warm and dry.  Dressing removed. Left 2nd digit with reduced swelling, erythema and tenderness compared to 2 day ago. No additional purulence. Xeroform gauze in place over nailbed. Incision lateral to nailbed healing well. Medial to nailbed skin appears macerated. Pt wished to keep dressing off for a few hours to let finger dry some. She will apply new dressing at home.  Psychiatric: She has a normal mood and affect. Her speech is normal and behavior is normal. Thought content normal.   BP 120/72 mmHg  Pulse 77  Temp(Src) 98 F (36.7 C) (Oral)  Resp 16  Ht 5\' 4"  (1.626 m)  Wt 219 lb (99.338 kg)  BMI 37.57 kg/m2  SpO2 99%     Assessment & Plan:  1. Paronychia of finger, left Wound healing nicely - no additional purulence. Finger appears macerated - she will let wound stay open to air for few hours before applying new dressing. Counseled on wound care. She will continue abx - preliminary results of wound culture mod staph aureus. Continue on doxy. Return if symptoms worsen or do not improve.   [place] Brackett, MHS [facility]  06/02/2014

## 2014-06-03 LAB — WOUND CULTURE: GRAM STAIN: NONE SEEN

## 2014-06-07 NOTE — Progress Notes (Signed)
Reviewed documentation and xray. Pt independently assessed by myself.  Agree w/ assessment and plan. Norberto SorensonEva Shaw, MD MPH

## 2015-06-30 ENCOUNTER — Ambulatory Visit (INDEPENDENT_AMBULATORY_CARE_PROVIDER_SITE_OTHER): Payer: Self-pay | Admitting: Physician Assistant

## 2015-06-30 VITALS — BP 118/82 | HR 71 | Temp 98.5°F | Resp 17 | Ht 64.0 in | Wt 235.0 lb

## 2015-06-30 DIAGNOSIS — R3915 Urgency of urination: Secondary | ICD-10-CM

## 2015-06-30 DIAGNOSIS — N3001 Acute cystitis with hematuria: Secondary | ICD-10-CM

## 2015-06-30 LAB — POCT URINALYSIS DIP (MANUAL ENTRY)
BILIRUBIN UA: NEGATIVE
Bilirubin, UA: NEGATIVE
GLUCOSE UA: NEGATIVE
Nitrite, UA: POSITIVE — AB
PROTEIN UA: NEGATIVE
Spec Grav, UA: 1.015
Urobilinogen, UA: 0.2
pH, UA: 5

## 2015-06-30 LAB — POCT URINE PREGNANCY: Preg Test, Ur: NEGATIVE

## 2015-06-30 LAB — POC MICROSCOPIC URINALYSIS (UMFC): MUCUS RE: ABSENT

## 2015-06-30 MED ORDER — NITROFURANTOIN MONOHYD MACRO 100 MG PO CAPS: 100.0000 mg | ORAL_CAPSULE | Freq: Two times a day (BID) | ORAL | Status: DC

## 2015-06-30 NOTE — Patient Instructions (Signed)
     IF you received an x-ray today, you will receive an invoice from [facility]. Please contact [facility] at [phone] with questions or concerns regarding your invoice.   IF you received labwork today, you will receive an invoice from [facility]. Please contact [facility] at [phone] with questions or concerns regarding your invoice.   Our billing staff will not be able to assist you with questions regarding bills from these companies.  You will be contacted with the lab results as soon as they are available. The fastest way to get your results is to activate your My Chart account. Instructions are located on the last page of this paperwork. If you have not heard from us regarding the results in 2 weeks, please contact this office.      

## 2015-06-30 NOTE — Progress Notes (Signed)
06/30/2015 2:59 PM   DOB: [birth year] / MRN: [id number]  SUBJECTIVE:  [name] is a 45 y.o. female presenting for urinary urgency.  States this symptom has been present for 2 weeks now and she though she was getting better but the symptoms "came back." She denies abnormal vaginal discharge.  Reports she has been practicing both anal and vaginal sex with a new partner.    She is allergic to erythromycin; ginseng; mushroom extract complex; pineapple; sulfa antibiotics; and other.   She  has a past medical history of HEPATITIS C; HYPONATREMIA, CHRONIC; LEUKOCYTOSIS UNSPECIFIED; TOBACCO DEPENDENCE; MIGRAINE, UNSPEC., W/O INTRACTABLE MIGRAINE; Irritable bowel syndrome; RENAL CALCULUS, HX OF; OSA (obstructive sleep apnea); DEPRESSION; DIABETES MELLITUS, TYPE II; ALLERGIC RHINITIS; HYPERTENSION; MRSA infection; CHF (congestive heart failure) (HCC); and Puncture wound of right foot.    She  reports that she has been smoking Cigarettes.  She has a 25 pack-year smoking history. She has never used smokeless tobacco. She reports that she does not drink alcohol or use illicit drugs. She  reports that she currently engages in sexual activity. The patient  has past surgical history that includes Abdominal hysterectomy (2007); Tubal ligation (1995); and Liver biopsy (1999).  Her family history includes Alcohol abuse in her other; Breast cancer in her maternal grandmother; Colon cancer in her other; Diabetes in her mother; Heart disease in her other; Lung cancer in her other; Mental illness in her father; Ovarian cancer in her other.  Review of Systems  Constitutional: Negative for fever and chills.  Cardiovascular: Negative for chest pain.  Gastrointestinal: Negative for diarrhea.  Genitourinary: Positive for urgency and frequency. Negative for dysuria, hematuria and flank pain.  Musculoskeletal: Negative for back pain.  Skin: Negative for rash.  Neurological: Negative for dizziness and headaches.     Problem list and medications reviewed and updated by myself where necessary, and exist elsewhere in the encounter.   OBJECTIVE:  BP 118/82 mmHg  Pulse 71  Temp(Src) 98.5 F (36.9 C) (Oral)  Resp 17  Ht  (1.626 m)  Wt 235 lb (106.595 kg)  BMI 40.32 kg/m2  SpO2 97%  Physical Exam  Constitutional: She is oriented to person, place, and time.  Cardiovascular: Normal rate and regular rhythm.   Pulmonary/Chest: Effort normal and breath sounds normal.  Musculoskeletal: Normal range of motion.  Neurological: She is alert and oriented to person, place, and time.  Skin: Skin is warm and dry.  Psychiatric: She has a normal mood and affect.  Vitals reviewed.   Results for orders placed or performed in visit on 06/30/15 (from the past 72 hour(s))  POCT urine pregnancy     Status: None   Collection Time: 06/30/15  2:58 PM  Result Value Ref Range   Preg Test, Ur Negative Negative  POCT urinalysis dipstick     Status: Abnormal   Collection Time: 06/30/15  2:58 PM  Result Value Ref Range   Color, UA yellow yellow   Clarity, UA clear clear   Glucose, UA negative negative   Bilirubin, UA negative negative   Ketones, POC UA negative negative   Spec Grav, UA 1.015    Blood, UA moderate (A) negative   pH, UA 5.0    Protein Ur, POC negative negative   Urobilinogen, UA 0.2    Nitrite, UA Positive (A) Negative   Leukocytes, UA small (1+) (A) Negative  POCT Microscopic Urinalysis ([facility])     Status: Abnormal   Collection Time: 06/30/15  2:58 PM  Result Value Ref Range   WBC,UR,HPF,POC Few (A) None WBC/hpf   RBC,UR,HPF,POC Too numerous to count  (A) None RBC/hpf   Bacteria Moderate (A) None, Too numerous to count   Mucus Absent Absent   Epithelial Cells, UR Per Microscopy Few (A) None, Too numerous to count cells/hpf    No results found.  ASSESSMENT AND PLAN  [name] was seen today for dysuria.  Diagnoses and all orders for this visit:  Urinary urgency: She has positive  nitrites.  Will start Nitrofurantoin.  Belly exam negative.  Given she is self pain will hold on urine culture at this time and advised that she call if her symptoms are not improved in a few days.    -     POCT urine pregnancy -     POCT urinalysis dipstick -     POCT Microscopic Urinalysis ([facility])  Acute cystitis with hematuria  Other orders -     nitrofurantoin, macrocrystal-monohydrate, (MACROBID) 100 MG capsule; Take 1 capsule (100 mg total) by mouth 2 (two) times daily.    The patient was advised to call or return to clinic if she does not see an improvement in symptoms or to seek the care of the closest emergency department if she worsens with the above plan.   Deliah BostonMichael Emanuelle Bastos, MHS, PA-C [facility] 06/30/2015 2:59 PM

## 2015-08-12 ENCOUNTER — Ambulatory Visit (INDEPENDENT_AMBULATORY_CARE_PROVIDER_SITE_OTHER): Payer: Self-pay | Admitting: Physician Assistant

## 2015-08-12 VITALS — BP 128/76 | HR 91 | Temp 98.4°F | Resp 16 | Ht 64.0 in | Wt 246.4 lb

## 2015-08-12 DIAGNOSIS — B9789 Other viral agents as the cause of diseases classified elsewhere: Principal | ICD-10-CM

## 2015-08-12 DIAGNOSIS — J069 Acute upper respiratory infection, unspecified: Secondary | ICD-10-CM

## 2015-08-12 DIAGNOSIS — J040 Acute laryngitis: Secondary | ICD-10-CM

## 2015-08-12 MED ORDER — GUAIFENESIN ER 1200 MG PO TB12: 1.0000 | ORAL_TABLET | Freq: Two times a day (BID) | ORAL | Status: DC | PRN

## 2015-08-12 MED ORDER — IPRATROPIUM BROMIDE 0.03 % NA SOLN: 2.0000 | Freq: Two times a day (BID) | NASAL | Status: DC

## 2015-08-12 MED ORDER — BENZONATATE 100 MG PO CAPS: 100.0000 mg | ORAL_CAPSULE | Freq: Three times a day (TID) | ORAL | Status: DC | PRN

## 2015-08-12 NOTE — Patient Instructions (Addendum)
Get plenty of rest and drink at least 64 ounces of water daily.     IF you received an x-ray today, you will receive an invoice from [facility]. Please contact [facility] at [phone] with questions or concerns regarding your invoice.   IF you received labwork today, you will receive an invoice from [facility]. Please contact [facility] at [phone] with questions or concerns regarding your invoice.   Our billing staff will not be able to assist you with questions regarding bills from these companies.  You will be contacted with the lab results as soon as they are available. The fastest way to get your results is to activate your My Chart account. Instructions are located on the last page of this paperwork. If you have not heard from us regarding the results in 2 weeks, please contact this office.     

## 2015-08-12 NOTE — Progress Notes (Signed)
Patient ID: [name], female    DOB: [birth year], 45 y.o.   MRN: [id number]  PCP: No primary care provider on file.  Subjective:   Chief Complaint  Patient presents with  . Nasal Congestion  . Cough    Productive x3weeks   . Generalized Body Aches  . Fever    HPI Presents for evaluation of several days of respiratory illness.  Feels like she may be getting better, but continues to cough, has laryngitis and generalized body aches. Fever 99. Sputum is clear-green.  Ibuprofen Daytime cold medication Benadryl (but it puts her to sleep) Pseudofed.  "Everytime I take Mucinex I get walking pneumonia."    Review of Systems As above.    Patient Active Problem List   Diagnosis Date Noted  . DIABETES MELLITUS, TYPE II 09/29/2009  . OBSTRUCTIVE SLEEP APNEA 12/16/2008  . HYPONATREMIA, CHRONIC 12/07/2008  . HEPATITIS C 11/16/2008  . LEUKOCYTOSIS UNSPECIFIED 11/03/2008  . DEPRESSION 11/03/2008  . ALLERGIC RHINITIS 11/03/2008  . HYPERTENSION 11/02/2008  . SLEEP APNEA 11/02/2008  . RENAL CALCULUS, HX OF 11/02/2008  . BACK PAIN 10/29/2008  . TOBACCO DEPENDENCE 04/19/2006  . MIGRAINE, UNSPEC., W/O INTRACTABLE MIGRAINE 04/19/2006  . IRRITABLE BOWEL SYNDROME 04/19/2006  . HEPATITIS NOS 04/19/2006     Prior to Admission medications   Medication Sig Start Date End Date Taking? Authorizing Provider  Aspirin-Caffeine (BC FAST PAIN RELIEF ARTHRITIS) 1000-65 MG PACK Take by mouth.   Yes Historical Provider, MD  ibuprofen (ADVIL,MOTRIN) 200 MG tablet Take 800 mg by mouth every 6 (six) hours as needed for pain.   Yes Historical Provider, MD  metFORMIN (GLUCOPHAGE) 500 MG tablet Take 1 tablet (500 mg total) by mouth daily with breakfast. 04/09/14  Yes Tishira R Brewington, PA-C     Allergies  Allergen Reactions  . Erythromycin Nausea And Vomiting    Stomach upset  . Ginseng Nausea And Vomiting and Swelling  . Mushroom Extract Complex Nausea And Vomiting  . Pineapple Hives     unknown  . Sulfa Antibiotics Hives and Itching    itching  . Other Hives and Rash    [place]       Objective:  Physical Exam  Constitutional: She is oriented to person, place, and time. She appears well-developed and well-nourished. No distress.  BP 128/76 mmHg  Pulse 91  Temp(Src) 98.4 F (36.9 C) (Oral)  Resp 16  Ht 5\' 4"  (1.626 m)  Wt 246 lb 6.4 oz (111.766 kg)  BMI 42.27 kg/m2  SpO2 97%   HENT:  Head: Normocephalic and atraumatic.  Right Ear: Hearing, tympanic membrane, external ear and ear canal normal.  Left Ear: Hearing, tympanic membrane, external ear and ear canal normal.  Nose: Mucosal edema (mild) present. No rhinorrhea.  No foreign bodies. Right sinus exhibits no maxillary sinus tenderness and no frontal sinus tenderness. Left sinus exhibits no maxillary sinus tenderness and no frontal sinus tenderness.  Mouth/Throat: Uvula is midline, oropharynx is clear and moist and mucous membranes are normal. No uvula swelling. No oropharyngeal exudate.  Eyes: Conjunctivae and EOM are normal. Pupils are equal, round, and reactive to light. Right eye exhibits no discharge. Left eye exhibits no discharge. No scleral icterus.  Neck: Trachea normal, normal range of motion and full passive range of motion without pain. Neck supple. No thyroid mass and no thyromegaly present.  Cardiovascular: Normal rate, regular rhythm and normal heart sounds.   Pulmonary/Chest: Effort normal and breath sounds normal.  Lymphadenopathy:  Head (right side): No submandibular, no tonsillar, no preauricular, no posterior auricular and no occipital adenopathy present.       Head (left side): No submandibular, no tonsillar, no preauricular and no occipital adenopathy present.    She has no cervical adenopathy.       Right: No supraclavicular adenopathy present.       Left: No supraclavicular adenopathy present.  Neurological: She is alert and oriented to person, place, and time. She has normal  strength. No cranial nerve deficit or sensory deficit.  Skin: Skin is warm, dry and intact. No rash noted.  Psychiatric: She has a normal mood and affect. Her speech is normal and behavior is normal.           Assessment & Plan:   1. Viral URI with cough 2. Laryngitis Unlikely that Mucinex caused her previous infections, and encouraged her to try again. Supportive care. Anticipatory guidance provided. - ipratropium (ATROVENT) 0.03 % nasal spray; Place 2 sprays into both nostrils 2 (two) times daily.  Dispense: 30 mL; Refill: 0 - benzonatate (TESSALON) 100 MG capsule; Take 1-2 capsules (100-200 mg total) by mouth 3 (three) times daily as needed for cough.  Dispense: 40 capsule; Refill: 0 - Guaifenesin (MUCINEX MAXIMUM STRENGTH) 1200 MG TB12; Take 1 tablet (1,200 mg total) by mouth every 12 (twelve) hours as needed.  Dispense: 14 tablet; Refill: 1    Fernande Brashelle S. Inda Mcglothen, PA-C Physician Assistant-Certified [facility]

## 2015-08-31 ENCOUNTER — Ambulatory Visit (INDEPENDENT_AMBULATORY_CARE_PROVIDER_SITE_OTHER): Payer: Self-pay | Admitting: Urgent Care

## 2015-08-31 ENCOUNTER — Ambulatory Visit (INDEPENDENT_AMBULATORY_CARE_PROVIDER_SITE_OTHER): Payer: Self-pay

## 2015-08-31 VITALS — BP 130/70 | HR 82 | Temp 97.9°F | Resp 18 | Ht 64.5 in | Wt 245.4 lb

## 2015-08-31 DIAGNOSIS — E1165 Type 2 diabetes mellitus with hyperglycemia: Secondary | ICD-10-CM

## 2015-08-31 DIAGNOSIS — IMO0001 Reserved for inherently not codable concepts without codable children: Secondary | ICD-10-CM

## 2015-08-31 DIAGNOSIS — F17200 Nicotine dependence, unspecified, uncomplicated: Secondary | ICD-10-CM

## 2015-08-31 DIAGNOSIS — R05 Cough: Secondary | ICD-10-CM

## 2015-08-31 DIAGNOSIS — R0602 Shortness of breath: Secondary | ICD-10-CM

## 2015-08-31 DIAGNOSIS — R062 Wheezing: Secondary | ICD-10-CM

## 2015-08-31 DIAGNOSIS — R059 Cough, unspecified: Secondary | ICD-10-CM

## 2015-08-31 LAB — POCT CBC
Granulocyte percent: 62.8 %G (ref 37–80)
HEMATOCRIT: 44.1 % (ref 37.7–47.9)
Hemoglobin: 15.7 g/dL (ref 12.2–16.2)
LYMPH, POC: 5.2 — AB (ref 0.6–3.4)
MCH, POC: 32 pg — AB (ref 27–31.2)
MCHC: 35.6 g/dL — AB (ref 31.8–35.4)
MCV: 90.1 fL (ref 80–97)
MID (CBC): 0.7 (ref 0–0.9)
MPV: 7.8 fL (ref 0–99.8)
POC GRANULOCYTE: 9.9 — AB (ref 2–6.9)
POC LYMPH %: 32.8 % (ref 10–50)
POC MID %: 4.4 % (ref 0–12)
Platelet Count, POC: 300 10*3/uL (ref 142–424)
RBC: 4.89 M/uL (ref 4.04–5.48)
RDW, POC: 13.5 %
WBC: 15.8 10*3/uL — AB (ref 4.6–10.2)

## 2015-08-31 LAB — GLUCOSE, POCT (MANUAL RESULT ENTRY): POC GLUCOSE: 188 mg/dL — AB (ref 70–99)

## 2015-08-31 LAB — POCT GLYCOSYLATED HEMOGLOBIN (HGB A1C): Hemoglobin A1C: 7.7

## 2015-08-31 MED ORDER — ALBUTEROL SULFATE (2.5 MG/3ML) 0.083% IN NEBU
2.5000 mg | INHALATION_SOLUTION | Freq: Once | RESPIRATORY_TRACT | Status: AC
  Administered 2015-08-31: 2.5 mg via RESPIRATORY_TRACT

## 2015-08-31 MED ORDER — HYDROCODONE-HOMATROPINE 5-1.5 MG/5ML PO SYRP: 5.0000 mL | ORAL_SOLUTION | Freq: Every evening | ORAL | Status: DC | PRN

## 2015-08-31 MED ORDER — IPRATROPIUM BROMIDE 0.02 % IN SOLN
0.5000 mg | Freq: Once | RESPIRATORY_TRACT | Status: AC
  Administered 2015-08-31: 0.5 mg via RESPIRATORY_TRACT

## 2015-08-31 MED ORDER — AZITHROMYCIN 250 MG PO TABS: ORAL_TABLET | ORAL | Status: DC

## 2015-08-31 MED ORDER — BENZONATATE 100 MG PO CAPS
100.0000 mg | ORAL_CAPSULE | Freq: Three times a day (TID) | ORAL | Status: DC | PRN
Start: 2015-08-31 — End: 2015-11-05

## 2015-08-31 MED ORDER — ALBUTEROL SULFATE HFA 108 (90 BASE) MCG/ACT IN AERS: 2.0000 | INHALATION_SPRAY | Freq: Four times a day (QID) | RESPIRATORY_TRACT | Status: DC | PRN

## 2015-08-31 NOTE — Addendum Note (Signed)
Addended by: Wallis BambergMANI, Ralyn Stlaurent on: 08/31/2015 03:45 PM   Modules accepted: Kipp BroodSmartSet

## 2015-08-31 NOTE — Progress Notes (Addendum)
MRN: [id number] DOB: 12/07/1970  Subjective:   [name] is a 45 y.o. female presenting for chief complaint of URI  Patient was initially seen on 08/12/2015, treated with supportive care for viral URI. Today, she reports persistent productive cough, cough causes incontinence and occasional vomiting. Cheryl has shob, occasional chest pain elicited by the cough. 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 She is currently taking Claritin D, Allegra, DayQuil, Robitussin, Mucinex, otc nasal spray (not Afrin). Admits history of asthma, states that it is very mild. Smokes ~7 cigarettes. Not interested quitting. Patient is a diabetic, admits dietary non-compliance, not currently exercising.   [name] has a current medication list which includes the following prescription(s): aspirin-caffeine, guaifenesin, ibuprofen, ipratropium, and metformin. Cheryl is allergic to erythromycin; ginseng; mushroom extract complex; pineapple; sulfa antibiotics; and other.  [name]  has a past medical history of HEPATITIS C; HYPONATREMIA, CHRONIC; LEUKOCYTOSIS UNSPECIFIED; TOBACCO DEPENDENCE; MIGRAINE, UNSPEC., W/O INTRACTABLE MIGRAINE; Irritable bowel syndrome; RENAL CALCULUS, HX OF; OSA (obstructive sleep apnea); DEPRESSION; DIABETES MELLITUS, TYPE II; ALLERGIC RHINITIS; HYPERTENSION; MRSA infection; CHF (congestive heart failure) (HCC); and Puncture wound of right foot. Cheryl  has past surgical history that includes Abdominal hysterectomy (2007); Tubal ligation (1995); and Liver biopsy (1999).  Objective:   Vitals: BP 130/70 mmHg  Pulse 82  Temp(Src) 97.9 F (36.6 C) (Oral)  Resp 18  Ht 5' 4.5" (1.638 m)  Wt 245 lb 6.4 oz (111.313 kg)  BMI 41.49 kg/m2  SpO2 96%  Physical Exam  Constitutional: She is oriented to person, place, and time. She appears well-developed and well-nourished.  Eyes: Right eye exhibits no discharge. Left eye  exhibits no discharge. No scleral icterus.  Neck: Normal range of motion. Neck supple.  Cardiovascular: Normal rate, regular rhythm and intact distal pulses.  Exam reveals no gallop and no friction rub.   No murmur heard. Pulmonary/Chest: No respiratory distress. She has wheezes (mid lung fields). She has rales (mid-lower lung fields).  Neurological: She is alert and oriented to person, place, and time.  Skin: Skin is warm and dry.   Dg Chest 2 View  08/31/2015  CLINICAL DATA:  45 year old female with a history of cough. History of smoking. EXAM: CHEST  2 VIEW COMPARISON:  06/06/2012. FINDINGS: Cardiomediastinal silhouette unchanged in size and contour. No pneumothorax or pleural effusion. No confluent airspace disease. Coarsened interstitial markings, similar to prior. No displaced fracture. IMPRESSION: No active cardiopulmonary disease. Signed, Yvone Neu. Loreta Ave, DO [facility] Radiology Electronically Signed   By: Gilmer Mor D.O.   On: 08/31/2015 11:48    Results for orders placed or performed in visit on 08/31/15 (from the past 24 hour(s))  POCT CBC     Status: Abnormal   Collection Time: 08/31/15 11:38 AM  Result Value Ref Range   WBC 15.8 (A) 4.6 - 10.2 K/uL   Lymph, poc 5.2 (A) 0.6 - 3.4   POC LYMPH PERCENT 32.8 10 - 50 %L   MID (cbc) 0.7 0 - 0.9   POC MID % 4.4 0 - 12 %M   POC Granulocyte 9.9 (A) 2 - 6.9   Granulocyte percent 62.8 37 - 80 %G   RBC 4.89 4.04 - 5.48 M/uL   Hemoglobin 15.7 12.2 - 16.2 g/dL   HCT, POC 78.2 95.6 - 47.9 %   MCV 90.1 80 - 97 fL   MCH, POC  32.0 (A) 27 - 31.2 pg   MCHC 35.6 (A) 31.8 - 35.4 g/dL   RDW, POC 16.113.5 %   Platelet Count, POC 300 142 - 424 K/uL   MPV 7.8 0 - 99.8 fL  POCT glycosylated hemoglobin (Hb A1C)     Status: None   Collection Time: 08/31/15 11:39 AM  Result Value Ref Range   Hemoglobin A1C 7.7   POCT glucose (manual entry)     Status: Abnormal   Collection Time: 08/31/15 11:39 AM    Result Value Ref Range   POC Glucose 188 (A) 70 - 99 mg/dl   Assessment and Plan :   1. Cough 2. Shortness of breath 3. Wheezing 4. Tobacco use disorder - Will cover for infectious process with Azithromycin, which the patient reports she has taken without issue in the past. Use albuterol inhaler, stop smoking at least for now. RTC in 1 week if no improvement. See below for steroid use/consideration.  5. Uncontrolled type 2 diabetes mellitus without complication, without long-term current use of insulin (HCC) - Will hold off on steroid course due to elevated blood sugars. Consider only if no improvement in the next few days.  Wallis BambergMario Merinda Victorino, PA-C [facility] [phone] 08/31/2015 11:04 AM

## 2015-08-31 NOTE — Patient Instructions (Addendum)
Cough, Adult Coughing is a reflex that clears your throat and your airways. Coughing helps to heal and protect your lungs. It is normal to cough occasionally, but a cough that happens with other symptoms or lasts a long time may be a sign of a condition that needs treatment. A cough may last only 2-3 weeks (acute), or it may last longer than 8 weeks (chronic). CAUSES Coughing is commonly caused by:  Breathing in substances that irritate your lungs.  A viral or bacterial respiratory infection.  Allergies.  Asthma.  Postnasal drip.  Smoking.  Acid backing up from the stomach into the esophagus (gastroesophageal reflux).  Certain medicines.  Chronic lung problems, including COPD (or rarely, lung cancer).  Other medical conditions such as heart failure. HOME CARE INSTRUCTIONS  Pay attention to any changes in your symptoms. Take these actions to help with your discomfort:  Take medicines only as told by your health care provider.  If you were prescribed an antibiotic medicine, take it as told by your health care provider. Do not stop taking the antibiotic even if you start to feel better.  Talk with your health care provider before you take a cough suppressant medicine.  Drink enough fluid to keep your urine clear or pale yellow.  If the air is dry, use a cold steam vaporizer or humidifier in your bedroom or your home to help loosen secretions.  Avoid anything that causes you to cough at work or at home.  If your cough is worse at night, try sleeping in a semi-upright position.  Avoid cigarette smoke. If you smoke, quit smoking. If you need help quitting, ask your health care provider.  Avoid caffeine.  Avoid alcohol.  Rest as needed. SEEK MEDICAL CARE IF:   You have new symptoms.  You cough up pus.  Your cough does not get better after 2-3 weeks, or your cough gets worse.  You cannot control your cough with suppressant medicines and you are losing sleep.  You  develop pain that is getting worse or pain that is not controlled with pain medicines.  You have a fever.  You have unexplained weight loss.  You have night sweats. SEEK IMMEDIATE MEDICAL CARE IF:  You cough up blood.  You have difficulty breathing.  Your heartbeat is very fast.   This information is not intended to replace advice given to you by your health care provider. Make sure you discuss any questions you have with your health care provider.   Document Released: 08/05/2010 Document Revised: 10/28/2014 Document Reviewed: 04/15/2014 Elsevier Interactive Patient Education 2016 Elsevier Inc[employer]     IF you received an x-ray today, you will receive an invoice from [facility]. Please contact  Radiology at [phone] with questions or concerns regarding your invoice.   IF you received labwork today, you will receive an invoice from [facility]. Please contact [facility] at [phone] with questions or concerns regarding your invoice.   Our billing staff will not be able to assist you with questions regarding bills from these companies.  You will be contacted with the lab results as soon as they are available. The fastest way to get your results is to activate your My Chart account. Instructions are located on the last page of this paperwork. If you have not heard from us regarding the results in 2 weeks, please contact this office.      

## 2015-11-05 ENCOUNTER — Ambulatory Visit (INDEPENDENT_AMBULATORY_CARE_PROVIDER_SITE_OTHER): Payer: Self-pay | Admitting: Physician Assistant

## 2015-11-05 VITALS — BP 118/82 | HR 86 | Temp 98.4°F | Resp 18 | Ht 64.0 in | Wt 248.2 lb

## 2015-11-05 DIAGNOSIS — R350 Frequency of micturition: Secondary | ICD-10-CM

## 2015-11-05 LAB — POCT URINALYSIS DIP (MANUAL ENTRY)
Bilirubin, UA: NEGATIVE
Glucose, UA: NEGATIVE
NITRITE UA: NEGATIVE
PROTEIN UA: NEGATIVE
SPEC GRAV UA: 1.025
UROBILINOGEN UA: 0.2
pH, UA: 5

## 2015-11-05 LAB — POC MICROSCOPIC URINALYSIS (UMFC): Mucus: ABSENT

## 2015-11-05 MED ORDER — NITROFURANTOIN MONOHYD MACRO 100 MG PO CAPS: 100.0000 mg | ORAL_CAPSULE | Freq: Two times a day (BID) | ORAL | 0 refills | Status: DC

## 2015-11-05 NOTE — Progress Notes (Signed)
11/05/2015 1:20 PM   DOB: November 14, 1970 / MRN: [id number]  SUBJECTIVE:  [name] is a 45 y.o. female presenting for urgency and frequency.  Associates inability to fully void and burning with urination.  She denies fever, chills, and flank pain.  She has tired cranberry juice and this has not helped.    She is allergic to erythromycin; ginseng; mushroom extract complex; pineapple; sulfa antibiotics; and other.   She  has a past medical history of ALLERGIC RHINITIS; CHF (congestive heart failure) (HCC); DEPRESSION; DIABETES MELLITUS, TYPE II; HEPATITIS C; MRSA infection; HYPERTENSION; HYPONATREMIA, CHRONIC; Irritable bowel syndrome; LEUKOCYTOSIS UNSPECIFIED; MIGRAINE, UNSPEC., W/O INTRACTABLE MIGRAINE; OSA (obstructive sleep apnea); Puncture wound of right foot; RENAL CALCULUS, HX OF; and TOBACCO DEPENDENCE.    She  reports that she has been smoking Cigarettes.  She has a 25.00 pack-year smoking history. She has never used smokeless tobacco. She reports that she does not drink alcohol or use drugs. She  reports that she currently engages in sexual activity. The patient  has a past surgical history that includes Abdominal hysterectomy (2007); Tubal ligation (1995); and Liver biopsy (1999).  Her family history includes Alcohol abuse in her other; Breast cancer in her maternal grandmother; Colon cancer in her other; Diabetes in her mother; Heart disease in her other; Lung cancer in her other; Mental illness in her father; Ovarian cancer in her other.  Review of Systems  Constitutional: Negative for chills and fever.  Gastrointestinal: Negative for abdominal pain.  Genitourinary: Negative for flank pain.  Skin: Negative for rash.  Neurological: Negative for dizziness.    The problem list and medications were reviewed and updated by myself where necessary and exist elsewhere in the encounter.   OBJECTIVE:  BP 118/82 (BP Location: Left Arm, Patient Position: Sitting, Cuff Size: Large)   Pulse  86   Temp 98.4 F (36.9 C) (Oral)   Resp 18   Ht 5\' 4"  (1.626 m)   Wt 248 lb 3.2 oz (112.6 kg)   SpO2 97%   BMI 42.60 kg/m   Physical Exam  Cardiovascular: Normal rate and regular rhythm.   Pulmonary/Chest: Effort normal and breath sounds normal.  Abdominal: There is no tenderness.    Results for orders placed or performed in visit on 11/05/15 (from the past 72 hour(s))  POCT urinalysis dipstick     Status: Abnormal   Collection Time: 11/05/15  1:08 PM  Result Value Ref Range   Color, UA yellow yellow   Clarity, UA cloudy (A) clear   Glucose, UA negative negative   Bilirubin, UA negative negative   Ketones, POC UA trace (5) (A) negative   Spec Grav, UA 1.025    Blood, UA moderate (A) negative   pH, UA 5.0    Protein Ur, POC negative negative   Urobilinogen, UA 0.2    Nitrite, UA Negative Negative   Leukocytes, UA moderate (2+) (A) Negative  POCT Microscopic Urinalysis ([facility])     Status: Abnormal   Collection Time: 11/05/15  1:10 PM  Result Value Ref Range   WBC,UR,HPF,POC Many (A) None WBC/hpf   RBC,UR,HPF,POC Few (A) None RBC/hpf   Bacteria Few (A) None, Too numerous to count   Mucus Absent Absent   Epithelial Cells, UR Per Microscopy None None, Too numerous to count cells/hpf    No results found.  ASSESSMENT AND PLAN  [name] was seen today for urinary frequency.  Diagnoses and all orders for this visit:  Urinary frequency: Urine results consistent with  UTI.  I am not culturing due to her lack of insurance.  She will contact me if she is not feeling better within 24-48 hours.   -     POCT Microscopic Urinalysis ([facility]) -     POCT urinalysis dipstick -     nitrofurantoin, macrocrystal-monohydrate, (MACROBID) 100 MG capsule; Take 1 capsule (100 mg total) by mouth 2 (two) times daily.    The patient is advised to call or return to clinic if she does not see an improvement in symptoms, or to seek the care of the closest emergency department if she worsens with  the above plan.   Deliah BostonMichael Dawnita Molner, MHS, PA-C [facility] 11/05/2015 1:20 PM

## 2015-11-05 NOTE — Patient Instructions (Signed)
     IF you received an x-ray today, you will receive an invoice from [facility]. Please contact  Radiology at [phone] with questions or concerns regarding your invoice.   IF you received labwork today, you will receive an invoice from [facility]. Please contact [facility] at [phone] with questions or concerns regarding your invoice.   Our billing staff will not be able to assist you with questions regarding bills from these companies.  You will be contacted with the lab results as soon as they are available. The fastest way to get your results is to activate your My Chart account. Instructions are located on the last page of this paperwork. If you have not heard from us regarding the results in 2 weeks, please contact this office.      

## 2017-05-24 ENCOUNTER — Encounter: Payer: Self-pay | Admitting: Physician Assistant

## 2020-01-30 ENCOUNTER — Emergency Department (HOSPITAL_COMMUNITY)
Admission: EM | Admit: 2020-01-30 | Discharge: 2020-01-30 | Disposition: A | Discharge status: Home / Self Care | Payer: PRIVATE HEALTH INSURANCE | Attending: Emergency Medicine | Admitting: Emergency Medicine

## 2020-01-30 ENCOUNTER — Other Ambulatory Visit: Payer: Self-pay

## 2020-01-30 ENCOUNTER — Encounter (HOSPITAL_COMMUNITY): Payer: Self-pay

## 2020-01-30 DIAGNOSIS — H6993 Unspecified Eustachian tube disorder, bilateral: Secondary | ICD-10-CM | POA: Insufficient documentation

## 2020-01-30 DIAGNOSIS — Z87891 Personal history of nicotine dependence: Secondary | ICD-10-CM | POA: Insufficient documentation

## 2020-01-30 DIAGNOSIS — E119 Type 2 diabetes mellitus without complications: Secondary | ICD-10-CM | POA: Insufficient documentation

## 2020-01-30 DIAGNOSIS — Z7982 Long term (current) use of aspirin: Secondary | ICD-10-CM | POA: Insufficient documentation

## 2020-01-30 DIAGNOSIS — H6983 Other specified disorders of Eustachian tube, bilateral: Secondary | ICD-10-CM

## 2020-01-30 DIAGNOSIS — I110 Hypertensive heart disease with heart failure: Secondary | ICD-10-CM | POA: Insufficient documentation

## 2020-01-30 DIAGNOSIS — I509 Heart failure, unspecified: Secondary | ICD-10-CM | POA: Insufficient documentation

## 2020-01-30 DIAGNOSIS — Z7984 Long term (current) use of oral hypoglycemic drugs: Secondary | ICD-10-CM | POA: Insufficient documentation

## 2020-01-30 MED ORDER — ONDANSETRON HCL 8 MG PO TABS: 8.0000 mg | ORAL_TABLET | Freq: Four times a day (QID) | ORAL | 0 refills | Status: DC | PRN

## 2020-01-30 MED ORDER — METRONIDAZOLE 500 MG PO TABS: 500.0000 mg | ORAL_TABLET | Freq: Two times a day (BID) | ORAL | 0 refills | Status: DC

## 2020-01-30 MED ORDER — CIPROFLOXACIN HCL 500 MG PO TABS: 500.0000 mg | ORAL_TABLET | Freq: Two times a day (BID) | ORAL | 0 refills | Status: DC

## 2020-01-30 MED ORDER — HYDROCODONE-ACETAMINOPHEN 5-325 MG PO TABS: 1.0000 | ORAL_TABLET | ORAL | 0 refills | Status: DC | PRN

## 2020-01-30 NOTE — ED Provider Notes (Signed)
[facility]-EMERGENCY DEPT Provider Note   CSN: [id number] Arrival date & time: 01/30/20  1021     History Chief Complaint  Patient presents with  . Otitis Media    [name] is a 49 y.o. female.  HPI She complains of pressure sensation in both ears starting on the right, for which she is using leftover amoxicillin.  She has used it for 8 days without relief.  She denies sneezing, coughing, sore throat, fever, chills, weakness or dizziness.  She is a smoker.  No known sick contacts.  There are no other known modifying factors.  Past Medical History:  Diagnosis Date  . ALLERGIC RHINITIS   . CHF (congestive heart failure) (HCC)   . DEPRESSION   . DIABETES MELLITUS, TYPE II   . HEPATITIS C   . Hx MRSA infection   . HYPERTENSION   . HYPONATREMIA, CHRONIC   . Irritable bowel syndrome   . LEUKOCYTOSIS UNSPECIFIED    chronic s/p heme eval (odogwu 2010)  . MIGRAINE, UNSPEC., W/O INTRACTABLE MIGRAINE   . OSA (obstructive sleep apnea)    CPAP qhs  . Puncture wound of right foot    penetrated 3/4 of an inch  . RENAL CALCULUS, HX OF   . TOBACCO DEPENDENCE     Patient Active Problem List   Diagnosis Date Noted  . DIABETES MELLITUS, TYPE II 09/29/2009  . OBSTRUCTIVE SLEEP APNEA 12/16/2008  . HYPONATREMIA, CHRONIC 12/07/2008  . HEPATITIS C 11/16/2008  . LEUKOCYTOSIS UNSPECIFIED 11/03/2008  . DEPRESSION 11/03/2008  . ALLERGIC RHINITIS 11/03/2008  . HYPERTENSION 11/02/2008  . SLEEP APNEA 11/02/2008  . RENAL CALCULUS, HX OF 11/02/2008  . BACK PAIN 10/29/2008  . TOBACCO DEPENDENCE 04/19/2006  . MIGRAINE, UNSPEC., W/O INTRACTABLE MIGRAINE 04/19/2006  . IRRITABLE BOWEL SYNDROME 04/19/2006  . HEPATITIS NOS 04/19/2006    Past Surgical History:  Procedure Laterality Date  . ABDOMINAL HYSTERECTOMY  2007  . LIVER BIOPSY  1999  . TUBAL LIGATION  1995     OB History   No obstetric history on file.     Family History  Problem Relation Age of  Onset  . Diabetes Mother   . Mental illness Father   . Breast cancer Maternal Grandmother   . Alcohol abuse Other        parents  . Colon cancer Other        grandparent  . Lung cancer Other        grandparent  . Ovarian cancer Other        grandparent  . Heart disease Other        parent    Social History   Tobacco Use  . Smoking status: Former Smoker    Packs/day: 1.00    Years: 25.00    Pack years: 25.00    Types: Cigarettes  . Smokeless tobacco: Never Used  Vaping Use  . Vaping Use: Never used  Substance Use Topics  . Alcohol use: No    Alcohol/week: 0.0 standard drinks  . Drug use: No    Home Medications Prior to Admission medications   Medication Sig Start Date End Date Taking? Authorizing Provider  albuterol (PROVENTIL HFA;VENTOLIN HFA) 108 (90 Base) MCG/ACT inhaler Inhale 2 puffs into the lungs every 6 (six) hours as needed for wheezing or shortness of breath (cough, shortness of breath or wheezing.). 08/31/15   Wallis Bamberg, PA-C  Aspirin-Caffeine (BC FAST PAIN RELIEF ARTHRITIS) 1000-65 MG PACK Take by mouth.    [provider]  fexofenadine-pseudoephedrine (ALLEGRA-D 24) 180-240 MG 24 hr tablet Take 1 tablet by mouth daily.    [provider]  ibuprofen (ADVIL,MOTRIN) 200 MG tablet Take 800 mg by mouth every 6 (six) hours as needed for pain.    [provider]  ipratropium (ATROVENT) 0.03 % nasal spray Place 2 sprays into both nostrils 2 (two) times daily. Patient not taking: Reported on 11/05/2015 08/12/15   Porfirio Oar, PA  loratadine (CLARITIN) 10 MG tablet Take 10 mg by mouth daily.    [provider]  metFORMIN (GLUCOPHAGE) 500 MG tablet Take 1 tablet (500 mg total) by mouth daily with breakfast. 04/09/14   Brewington, Tishira R, PA-C  nitrofurantoin, macrocrystal-monohydrate, (MACROBID) 100 MG capsule Take 1 capsule (100 mg total) by mouth 2 (two) times daily. 11/05/15   Ofilia Neas, PA-C    Allergies     Erythromycin, Ginseng, Mushroom extract complex, Penicillins, Pineapple, Sulfa antibiotics, and Other  Review of Systems   Review of Systems  All other systems reviewed and are negative.   Physical Exam Updated Vital Signs BP (!) 162/99 (BP Location: Left Arm)   Pulse 79   Temp 98.7 F (37.1 C) (Oral)   Resp 18   Ht 5\' 4"  (1.626 m)   Wt 118.8 kg   SpO2 100%   BMI 44.97 kg/m   Physical Exam Vitals and nursing note reviewed.  Constitutional:      General: She is not in acute distress.    Appearance: She is well-developed and well-nourished. She is obese. She is not ill-appearing, toxic-appearing or diaphoretic.  HENT:     Head: Normocephalic and atraumatic.     Ears:     Comments: TMs are slightly reddened bilaterally with appearance of fluid behind the left eardrum.  No TM deformities.    Nose: No congestion or rhinorrhea.     Mouth/Throat:     Pharynx: No oropharyngeal exudate or posterior oropharyngeal erythema.  Eyes:     Extraocular Movements: EOM normal.     Conjunctiva/sclera: Conjunctivae normal.     Pupils: Pupils are equal, round, and reactive to light.  Neck:     Trachea: Phonation normal.  Cardiovascular:     Rate and Rhythm: Normal rate.  Pulmonary:     Effort: Pulmonary effort is normal.  Abdominal:     General: There is no distension.  Musculoskeletal:        General: Normal range of motion.     Cervical back: Normal range of motion and neck supple.  Skin:    General: Skin is warm and dry.  Neurological:     Mental Status: She is alert and oriented to person, place, and time.     Motor: No abnormal muscle tone.  Psychiatric:        Mood and Affect: Mood and affect and mood normal.        Behavior: Behavior normal.        Thought Content: Thought content normal.        Judgment: Judgment normal.     ED Results / Procedures / Treatments   Labs (all labs ordered are listed, but only abnormal results are displayed) Labs Reviewed - No data to  display  EKG None  Radiology No results found.  Procedures Procedures (including critical care time)  Medications Ordered in ED Medications - No data to display  ED Course  I have reviewed the triage vital signs and the nursing notes.  Pertinent labs &  imaging results that were available during my care of the patient were reviewed by me and considered in my medical decision making (see chart for details).    MDM Rules/Calculators/A&P                           Patient Vitals for the past 24 hrs:  BP Temp Temp src Pulse Resp SpO2 Height Weight  01/30/20 1040 -- -- -- -- -- -- 5\' 4"  (1.626 m) 118.8 kg  01/30/20 1029 (!) 162/99 98.7 F (37.1 C) Oral 79 18 100 % -- --  01/30/20 1028 -- -- -- -- -- -- 5\' 4"  (1.626 m) 118.8 kg    11:03 AM Reevaluation with update and discussion. After initial assessment and treatment, an updated evaluation reveals no additional complaints, findings discussed and questions answered. 14/10/21   Medical Decision Making:  This patient is presenting for evaluation of ear discomfort, which does require a range of treatment options, and is a complaint that involves a moderate risk of morbidity and mortality. The differential diagnoses include sinusitis, eustachian tube dysfunction, URI. I decided to review old records, and in summary middle-aged female, smoker, presenting with ear pain, but no significant clinical signs for acute infectious process.  I did not require additional historical information from anyone.    Critical Interventions-clinical evaluation, discussion with patient  After These Interventions, the Patient was reevaluated and was found stable for discharge.  Suspect eustachian tube dysfunction leading to fluid collection primarily in the middle ear on the left at this time.  Doubt significant acute sinusitis, pandemic Covid infection or serious bacterial infection.  CRITICAL CARE-no Performed by:  Nursing Notes  Reviewed/ Care Coordinated Applicable Imaging Reviewed Interpretation of Laboratory Data incorporated into ED treatment  The patient appears reasonably screened and/or stabilized for discharge and I doubt any other medical condition or other [facility] requiring further screening, evaluation, or treatment in the ED at this time prior to discharge.  Plan: Home Medications-continue current, use Flonase nasal spray twice a day for 2 or 3 weeks; Home Treatments-rest, fluids; return here if the recommended treatment, does not improve the symptoms; Recommended follow up-PCP as needed     Final Clinical Impression(s) / ED Diagnoses Final diagnoses:  Dysfunction of both eustachian tubes    Rx / DC Orders ED Discharge Orders         Ordered    HYDROcodone-acetaminophen (NORCO) 5-325 MG tablet  Every 4 hours PRN,   Status:  Discontinued        01/30/20 1046    ciprofloxacin (CIPRO) 500 MG tablet  2 times daily,   Status:  Discontinued        01/30/20 1046    metroNIDAZOLE (FLAGYL) 500 MG tablet  2 times daily,   Status:  Discontinued        01/30/20 1046    ondansetron (ZOFRAN) 8 MG tablet  Every 6 hours PRN,   Status:  Discontinued        01/30/20 1046           14/10/21, MD 01/30/20 1105

## 2020-01-30 NOTE — ED Triage Notes (Signed)
Pt arrives to ED w/cc of media otitis to right ear. Sts she took amoxacillin for 8 days with no relief. Has not been taking amoxacillin for 11 days now. Also states she took benadryl and ibuprofen with no relief. Endorses a "muffled" pressure in right ear. Pt states it is starting to spread to other ear.

## 2020-01-30 NOTE — Discharge Instructions (Addendum)
The pressure in your ears is likely from fluid collecting in the middle ear which may be associated with sinus congestion or allergies.  We recommend using Flonase, 1 spray in each nostril twice a day for 2 or 3 weeks.  This should gradually improve your symptoms.  Use Tylenol or Motrin if needed for pain control.  Follow-up with your doctor if not better in 1 to 2 weeks, or if you develop signs of infection including increasing pain, bleeding or drainage of purulent material.

## 2022-10-19 DIAGNOSIS — R1011 Right upper quadrant pain: Secondary | ICD-10-CM | POA: Diagnosis not present

## 2022-10-19 DIAGNOSIS — R112 Nausea with vomiting, unspecified: Secondary | ICD-10-CM | POA: Diagnosis not present

## 2022-10-20 ENCOUNTER — Emergency Department (HOSPITAL_COMMUNITY)
Admission: EM | Admit: 2022-10-20 | Discharge: 2022-10-20 | Disposition: A | Discharge status: Home / Self Care | Payer: BC Managed Care – PPO | Attending: Emergency Medicine | Admitting: Emergency Medicine

## 2022-10-20 ENCOUNTER — Other Ambulatory Visit: Payer: Self-pay

## 2022-10-20 ENCOUNTER — Encounter (HOSPITAL_COMMUNITY): Payer: Self-pay

## 2022-10-20 ENCOUNTER — Emergency Department (HOSPITAL_COMMUNITY): Payer: BC Managed Care – PPO

## 2022-10-20 DIAGNOSIS — Z7984 Long term (current) use of oral hypoglycemic drugs: Secondary | ICD-10-CM | POA: Diagnosis not present

## 2022-10-20 DIAGNOSIS — Z20822 Contact with and (suspected) exposure to covid-19: Secondary | ICD-10-CM | POA: Diagnosis not present

## 2022-10-20 DIAGNOSIS — I10 Essential (primary) hypertension: Secondary | ICD-10-CM | POA: Diagnosis not present

## 2022-10-20 DIAGNOSIS — Z7982 Long term (current) use of aspirin: Secondary | ICD-10-CM | POA: Diagnosis not present

## 2022-10-20 DIAGNOSIS — N12 Tubulo-interstitial nephritis, not specified as acute or chronic: Secondary | ICD-10-CM

## 2022-10-20 DIAGNOSIS — K5790 Diverticulosis of intestine, part unspecified, without perforation or abscess without bleeding: Secondary | ICD-10-CM | POA: Diagnosis not present

## 2022-10-20 DIAGNOSIS — E119 Type 2 diabetes mellitus without complications: Secondary | ICD-10-CM | POA: Insufficient documentation

## 2022-10-20 DIAGNOSIS — R109 Unspecified abdominal pain: Secondary | ICD-10-CM | POA: Diagnosis not present

## 2022-10-20 DIAGNOSIS — Z90710 Acquired absence of both cervix and uterus: Secondary | ICD-10-CM | POA: Diagnosis not present

## 2022-10-20 LAB — CBC WITH DIFFERENTIAL/PLATELET
Abs Immature Granulocytes: 0.08 10*3/uL — ABNORMAL HIGH (ref 0.00–0.07)
Basophils Absolute: 0.1 10*3/uL (ref 0.0–0.1)
Basophils Relative: 1 %
Eosinophils Absolute: 0 10*3/uL (ref 0.0–0.5)
Eosinophils Relative: 0 %
HCT: 44.3 % (ref 36.0–46.0)
Hemoglobin: 15.3 g/dL — ABNORMAL HIGH (ref 12.0–15.0)
Immature Granulocytes: 1 %
Lymphocytes Relative: 18 %
Lymphs Abs: 2.7 10*3/uL (ref 0.7–4.0)
MCH: 31.8 pg (ref 26.0–34.0)
MCHC: 34.5 g/dL (ref 30.0–36.0)
MCV: 92.1 fL (ref 80.0–100.0)
Monocytes Absolute: 2 10*3/uL — ABNORMAL HIGH (ref 0.1–1.0)
Monocytes Relative: 13 %
Neutro Abs: 9.9 10*3/uL — ABNORMAL HIGH (ref 1.7–7.7)
Neutrophils Relative %: 67 %
Platelets: 338 10*3/uL (ref 150–400)
RBC: 4.81 MIL/uL (ref 3.87–5.11)
RDW: 12.1 % (ref 11.5–15.5)
WBC: 14.7 10*3/uL — ABNORMAL HIGH (ref 4.0–10.5)
nRBC: 0 % (ref 0.0–0.2)

## 2022-10-20 LAB — URINALYSIS, ROUTINE W REFLEX MICROSCOPIC
Bilirubin Urine: NEGATIVE
Glucose, UA: >=500 mg/dL — AB
Hgb urine dipstick: NEGATIVE
Ketones, ur: 20 mg/dL — AB
Nitrite: NEGATIVE
Protein, ur: 30 mg/dL — AB
Specific Gravity, Urine: 1.012 (ref 1.005–1.030)
WBC, UA: >50 WBC/hpf (ref 0–5)
pH: 6 (ref 5.0–8.0)

## 2022-10-20 LAB — COMPREHENSIVE METABOLIC PANEL
ALT: 32 U/L (ref 0–44)
AST: 18 U/L (ref 15–41)
Albumin: 3.2 g/dL — ABNORMAL LOW (ref 3.5–5.0)
Alkaline Phosphatase: 66 U/L (ref 38–126)
Anion gap: 10 (ref 5–15)
BUN: 12 mg/dL (ref 6–20)
CO2: 21 mmol/L — ABNORMAL LOW (ref 22–32)
Calcium: 8.7 mg/dL — ABNORMAL LOW (ref 8.9–10.3)
Chloride: 101 mmol/L (ref 98–111)
Creatinine, Ser: 0.74 mg/dL (ref 0.44–1.00)
GFR, Estimated: >60 mL/min (ref >60)
Glucose, Bld: 314 mg/dL — ABNORMAL HIGH (ref 70–99)
Potassium: 3.8 mmol/L (ref 3.5–5.1)
Sodium: 132 mmol/L — ABNORMAL LOW (ref 135–145)
Total Bilirubin: 0.9 mg/dL (ref 0.3–1.2)
Total Protein: 7.6 g/dL (ref 6.5–8.1)

## 2022-10-20 LAB — LIPASE, BLOOD: Lipase: 27 U/L (ref 11–51)

## 2022-10-20 LAB — SARS CORONAVIRUS 2 BY RT PCR: SARS Coronavirus 2 by RT PCR: NEGATIVE

## 2022-10-20 MED ORDER — CEFADROXIL 500 MG PO CAPS: 500.0000 mg | ORAL_CAPSULE | Freq: Two times a day (BID) | ORAL | 0 refills | Status: AC

## 2022-10-20 MED ORDER — SODIUM CHLORIDE (PF) 0.9 % IJ SOLN
INTRAMUSCULAR | Status: AC
  Filled 2022-10-20: qty 50

## 2022-10-20 MED ORDER — SODIUM CHLORIDE 0.9 % IV SOLN
2.0000 g | Freq: Once | INTRAVENOUS | Status: AC
  Administered 2022-10-20: 2 g via INTRAVENOUS
  Filled 2022-10-20: qty 20

## 2022-10-20 MED ORDER — IBUPROFEN 200 MG PO TABS
600.0000 mg | ORAL_TABLET | Freq: Once | ORAL | Status: AC
  Administered 2022-10-20: 600 mg via ORAL
  Filled 2022-10-20: qty 3

## 2022-10-20 MED ORDER — IOHEXOL 300 MG/ML  SOLN
100.0000 mL | Freq: Once | INTRAMUSCULAR | Status: AC | PRN
  Administered 2022-10-20: 100 mL via INTRAVENOUS

## 2022-10-20 MED ORDER — SODIUM CHLORIDE 0.9 % IV BOLUS
1000.0000 mL | Freq: Once | INTRAVENOUS | Status: AC
  Administered 2022-10-20: 1000 mL via INTRAVENOUS

## 2022-10-20 NOTE — ED Triage Notes (Signed)
RUQ abdominal pain beginning yesterday. Says she went to UC and was given SL zofran for nausea with improvement.   Was encouraged to ED for worsening pain and new onset fevers. Says fevers on 101.4 at home. 100.5 in triage.

## 2022-10-20 NOTE — Discharge Instructions (Signed)
As we discussed, your workup in the ER today revealed that you have pyelonephritis.  This is basically when you have a urinary tract infection that travels to your kidney.  We have given you 1 round of IV antibiotics for this as well as fluids.  I have also given you a prescription for oral antibiotics for you to fill and take as prescribed in its entirety for management of your symptoms.  I recommend calling your primary doctor to schedule a follow-up appointment at your earliest convenience.  Additionally, as we discussed this is something that we sometimes admit people to the hospital for and therefore if your symptoms are not improving or getting worse in the next few days then you may need to return and be admitted to the hospital overnight.  We did collect a urine culture and if the culture grows a bacteria that is not well covered by the antibiotic that you are prescribed then a pharmacist will call you to change this prescription.  Please be on the look out for this and be sure to answer phone calls from unknown numbers over the next few days.  Return if development of any new or worsening symptoms.

## 2022-10-20 NOTE — ED Provider Notes (Signed)
[place] EMERGENCY DEPARTMENT AT [facility] Provider Note   CSN: [id number] Arrival date & time: 10/20/22  0011     History  Chief Complaint  Patient presents with   Abdominal Pain    [name] is a 52 y.o. female.  Patient with history of hypertension, diabetes presents today with complaints of abdominal pain.  She states that same is an the right upper quadrant of her abdomen and radiates to her right flank.  She was initially nauseous without vomiting however when she went to urgent care initially for her symptoms they gave her Zofran and she has not had any residual nausea since then.  Urgent care recommended she come here for evaluation.  She denies any history of similar symptoms previously does note malodorous urine but denies any dysuria.  She was initially febrile when she came to the department as well.  Denies any history of abdominal surgeries.  The history is provided by the patient. No language interpreter was used.  Abdominal Pain      Home Medications Prior to Admission medications   Medication Sig Start Date End Date Taking? Authorizing Provider  albuterol (PROVENTIL HFA;VENTOLIN HFA) 108 (90 Base) MCG/ACT inhaler Inhale 2 puffs into the lungs every 6 (six) hours as needed for wheezing or shortness of breath (cough, shortness of breath or wheezing.). 08/31/15   Wallis Bamberg, PA-C  Aspirin-Caffeine (BC FAST PAIN RELIEF ARTHRITIS) 1000-65 MG PACK Take by mouth.    [provider]  fexofenadine-pseudoephedrine (ALLEGRA-D 24) 180-240 MG 24 hr tablet Take 1 tablet by mouth daily.    [provider]  ibuprofen (ADVIL,MOTRIN) 200 MG tablet Take 800 mg by mouth every 6 (six) hours as needed for pain.    [provider]  ipratropium (ATROVENT) 0.03 % nasal spray Place 2 sprays into both nostrils 2 (two) times daily. Patient not taking: Reported on 11/05/2015 08/12/15   Porfirio Oar, PA  loratadine (CLARITIN) 10 MG tablet Take 10  mg by mouth daily.    [provider]  metFORMIN (GLUCOPHAGE) 500 MG tablet Take 1 tablet (500 mg total) by mouth daily with breakfast. 04/09/14   Brewington, Tishira R, PA-C  nitrofurantoin, macrocrystal-monohydrate, (MACROBID) 100 MG capsule Take 1 capsule (100 mg total) by mouth 2 (two) times daily. 11/05/15   Ofilia Neas, PA-C      Allergies    Erythromycin, Ginseng, Mushroom extract complex, Penicillins, Pineapple, Sulfa antibiotics, and Other    Review of Systems   Review of Systems  Gastrointestinal:  Positive for abdominal pain.  Genitourinary:  Positive for flank pain.  All other systems reviewed and are negative.   Physical Exam Updated Vital Signs BP (!) 129/53   Pulse 90   Temp 98.9 F (37.2 C) (Oral)   Resp 16   Ht 5\' 4"  (1.626 m)   Wt 117.9 kg   SpO2 100%   BMI 44.63 kg/m  Physical Exam Vitals and nursing note reviewed.  Constitutional:      General: She is not in acute distress.    Appearance: Normal appearance. She is normal weight. She is not ill-appearing, toxic-appearing or diaphoretic.  HENT:     Head: Normocephalic and atraumatic.  Cardiovascular:     Rate and Rhythm: Normal rate.  Pulmonary:     Effort: Pulmonary effort is normal. No respiratory distress.  Abdominal:     General: Abdomen is flat.     Palpations: Abdomen is soft.     Tenderness: There is no  abdominal tenderness. There is right CVA tenderness.  Musculoskeletal:        General: Normal range of motion.     Cervical back: Normal range of motion.  Skin:    General: Skin is warm and dry.  Neurological:     General: No focal deficit present.     Mental Status: She is alert.  Psychiatric:        Mood and Affect: Mood normal.        Behavior: Behavior normal.     ED Results / Procedures / Treatments   Labs (all labs ordered are listed, but only abnormal results are displayed) Labs Reviewed  COMPREHENSIVE METABOLIC PANEL - Abnormal; Notable for the following  components:      Result Value   Sodium 132 (*)    CO2 21 (*)    Glucose, Bld 314 (*)    Calcium 8.7 (*)    Albumin 3.2 (*)    All other components within normal limits  URINALYSIS, ROUTINE W REFLEX MICROSCOPIC - Abnormal; Notable for the following components:   APPearance CLOUDY (*)    Glucose, UA >=500 (*)    Ketones, ur 20 (*)    Protein, ur 30 (*)    Leukocytes,Ua LARGE (*)    Bacteria, UA MANY (*)    All other components within normal limits  CBC WITH DIFFERENTIAL/PLATELET - Abnormal; Notable for the following components:   WBC 14.7 (*)    Hemoglobin 15.3 (*)    Neutro Abs 9.9 (*)    Monocytes Absolute 2.0 (*)    Abs Immature Granulocytes 0.08 (*)    All other components within normal limits  SARS CORONAVIRUS 2 BY RT PCR  URINE CULTURE  LIPASE, BLOOD    EKG None  Radiology CT ABDOMEN PELVIS W CONTRAST  Result Date: 10/20/2022 CLINICAL DATA:  Abdominal pain, acute, nonlocalized EXAM: CT ABDOMEN AND PELVIS WITH CONTRAST TECHNIQUE: Multidetector CT imaging of the abdomen and pelvis was performed using the standard protocol following bolus administration of intravenous contrast. RADIATION DOSE REDUCTION: This exam was performed according to the departmental dose-optimization program which includes automated exposure control, adjustment of the mA and/or kV according to patient size and/or use of iterative reconstruction technique. CONTRAST:  OMNIPAQUE IOHEXOL 300 MG/ML  SOLN COMPARISON:  CT AP 06/06/12 FINDINGS: Lower chest: No acute abnormality. Hepatobiliary: Liver has a normal contour. No focal liver lesions are visualized. Portal veins are contrast opacify. No evidence of intra or extrahepatic biliary ductal dilatation. No evidence of cholelithiasis or cholecystitis. Pancreas: No evidence of peripancreatic fat stranding to suggest pancreatitis. No evidence of pancreatic ductal dilatation. Spleen: Normal in size without focal abnormality. Adrenals/Urinary Tract: Bilateral  adrenal glands are normal in appearance. Bilateral kidneys are normal in size without evidence of hydronephrosis or nephrolithiasis. There are subtle regions of cortical hypoenhancement along the upper pole in the interpolar region of the right kidney (series 7, image 89), which are worrisome for pyelonephritis. The urinary bladder is fluid-filled with a small amount of air in the anti dependent portion, which can be seen in the setting of infection. Stomach/Bowel: No evidence of bowel obstruction. There is diverticulosis without evidence of diverticulitis. The appendix is normal in appearance Vascular/Lymphatic: No significant vascular findings are present. No enlarged abdominal or pelvic lymph nodes. Reproductive: Status post hysterectomy. No adnexal masses. Other: No abdominal wall hernia or abnormality. No abdominopelvic ascites. Musculoskeletal: No acute or significant osseous findings. IMPRESSION: 1. Subtle regions of cortical hypoenhancement along the upper pole  and interpolar region of the right kidney are worrisome for pyelonephritis. Recommend correlation with urinalysis. 2. Small amount of air in the anti dependent portion of the urinary bladder, which can be seen in the setting of infection. Recommend correlation with urinalysis. Electronically Signed   By: Lorenza Cambridge M.D.   On: 10/20/2022 08:04    Procedures Procedures    Medications Ordered in ED Medications  ibuprofen (ADVIL) tablet 600 mg (600 mg Oral Given 10/20/22 0051)  iohexol (OMNIPAQUE) 300 MG/ML solution 100 mL (100 mLs Intravenous Contrast Given 10/20/22 0738)  cefTRIAXone (ROCEPHIN) 2 g in sodium chloride 0.9 % 100 mL IVPB (0 g Intravenous Stopped 10/20/22 0909)  sodium chloride 0.9 % bolus 1,000 mL (1,000 mLs Intravenous New Bag/Given 10/20/22 0981)    ED Course/ Medical Decision Making/ A&P                                 Medical Decision Making Amount and/or Complexity of Data Reviewed Labs: ordered.   This patient  is a 52 y.o. female who presents to the ED for concern of abdominal pain, flank pain, this involves an extensive number of treatment options, and is a complaint that carries with it a high risk of complications and morbidity. The emergent differential diagnosis prior to evaluation includes, but is not limited to, renal artery/vein embolism/thrombosis, mesenteric ischemia, pyelonephritis, nephrolithiasis, cystitis, biliary colic, pancreatitis, perforated peptic ulcer, appendicitis, diverticulitis, bowel obstruction   This is not an exhaustive differential.   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 SDOH Screenings   Tobacco Use: Medium Risk (10/20/2022)    Additional history: Chart reviewed.  Physical Exam: Physical exam performed. The pertinent findings include: Right sided CVA tenderness  Lab Tests: I ordered, and personally interpreted labs.  The pertinent results include:  WBC 14.7, Na 132, bicarb 2, glucose 314, UA infectious. Urine culture pending   Imaging Studies: I ordered imaging studies including CT abdomen pelvis. I independently visualized and interpreted imaging which showed   1. Subtle regions of cortical hypoenhancement along the upper pole and interpolar region of the right kidney are worrisome for pyelonephritis. Recommend correlation with urinalysis. 2. Small amount of air in the anti dependent portion of the urinary bladder, which can be seen in the setting of infection. Recommend correlation with urinalysis.  I agree with the radiologist interpretation.    Medications: I ordered medication including IV Rocephin, fluids  for pyelonephritis. Reevaluation of the patient after these medicines showed that the patient improved. I have reviewed the patients home medicines and have made adjustments as needed.   Disposition: After consideration of the diagnostic results and the patients response to treatment, I feel that emergency department workup does not suggest an emergent condition requiring admission or immediate intervention beyond what has been performed at this time. The plan is: Discharge with [facility], close outpatient follow-up, and close return precautions.  Patient is currently afebrile, nontoxic-appearing, and in no acute distress with reassuring vital signs.  Her pain is minimal. I did offer admission given her leukocytosis with fever and pyelonephritis on CT, however she would prefer to try and manage  symptoms at home with oral antibiotics. Evaluation and diagnostic testing in the emergency department does not suggest an emergent condition requiring admission or immediate intervention beyond what has been performed at this time.  Plan for discharge with close PCP follow-up.  Patient is understanding and amenable with plan, educated on red flag symptoms that would prompt immediate return.  Patient discharged in stable condition.  Final Clinical Impression(s) / ED Diagnoses Final diagnoses:  Pyelonephritis    Rx / DC Orders ED Discharge Orders          Ordered    cefadroxil (DURICEF) 500 MG capsule  2 times daily        10/20/22 0956          An After Visit Summary was printed and given to the patient.     Vear Clock 10/20/22 1914    Bethann Berkshire, MD 10/21/22 [phone]

## 2022-10-22 LAB — URINE CULTURE: Culture: >=100000 — AB

## 2022-10-23 ENCOUNTER — Telehealth (HOSPITAL_BASED_OUTPATIENT_CLINIC_OR_DEPARTMENT_OTHER): Payer: Self-pay

## 2022-10-23 NOTE — Telephone Encounter (Signed)
Post ED Visit - Positive Culture Follow-up  Culture report reviewed by antimicrobial stewardship pharmacist: [facility] []  Enzo Bi, Pharm.D. []  Celedonio Miyamoto, Pharm.D., BCPS AQ-ID []  Garvin Fila, Pharm.D., BCPS []  Georgina Pillion, Pharm.D., BCPS []  [place], [address].D., BCPS, AAHIVP []  Estella Husk, Pharm.D., BCPS, AAHIVP []  Lysle Pearl, PharmD, BCPS []  Phillips Climes, PharmD, BCPS []  Agapito Games, PharmD, BCPS []  Verlan Friends, PharmD []  Mervyn Gay, PharmD, BCPS []  Vinnie Level, PharmD  [facility] [x]  Tacy Learn, PharmD []  Greer Pickerel, PharmD []  Adalberto Cole, PharmD []  Perlie Gold, Rph []  Lonell Face) Jean Rosenthal, PharmD []  Earl Many, PharmD []  Junita Push, PharmD []  Dorna Leitz, PharmD []  Terrilee Files, PharmD []  Lynann Beaver, PharmD []  Keturah Barre, PharmD []  Loralee Pacas, PharmD []  Bernadene Person, PharmD   Positive urine culture Treated with Cefadroxil, organism sensitive to the same and no further patient follow-up is required at this time.  Sandria Senter 10/23/2022, 12:42 PM

## 2022-11-08 ENCOUNTER — Encounter: Payer: Self-pay | Admitting: Family Medicine

## 2022-11-08 ENCOUNTER — Ambulatory Visit: Payer: BC Managed Care – PPO | Admitting: Family Medicine

## 2022-11-08 VITALS — BP 128/80 | HR 84 | Temp 97.0°F | Ht 64.25 in | Wt 216.6 lb

## 2022-11-08 DIAGNOSIS — N12 Tubulo-interstitial nephritis, not specified as acute or chronic: Secondary | ICD-10-CM

## 2022-11-08 DIAGNOSIS — F1021 Alcohol dependence, in remission: Secondary | ICD-10-CM

## 2022-11-08 DIAGNOSIS — Z1159 Encounter for screening for other viral diseases: Secondary | ICD-10-CM | POA: Diagnosis not present

## 2022-11-08 DIAGNOSIS — F3010 Manic episode without psychotic symptoms, unspecified: Secondary | ICD-10-CM

## 2022-11-08 DIAGNOSIS — N390 Urinary tract infection, site not specified: Secondary | ICD-10-CM

## 2022-11-08 DIAGNOSIS — M25551 Pain in right hip: Secondary | ICD-10-CM

## 2022-11-08 DIAGNOSIS — I10 Essential (primary) hypertension: Secondary | ICD-10-CM

## 2022-11-08 DIAGNOSIS — R319 Hematuria, unspecified: Secondary | ICD-10-CM

## 2022-11-08 DIAGNOSIS — E1142 Type 2 diabetes mellitus with diabetic polyneuropathy: Secondary | ICD-10-CM | POA: Diagnosis not present

## 2022-11-08 DIAGNOSIS — F340 Cyclothymic disorder: Secondary | ICD-10-CM | POA: Diagnosis not present

## 2022-11-08 DIAGNOSIS — G4733 Obstructive sleep apnea (adult) (pediatric): Secondary | ICD-10-CM

## 2022-11-08 DIAGNOSIS — F331 Major depressive disorder, recurrent, moderate: Secondary | ICD-10-CM

## 2022-11-08 LAB — POCT GLYCOSYLATED HEMOGLOBIN (HGB A1C): Hemoglobin A1C: 13.5 % — AB (ref 4.0–5.6)

## 2022-11-08 MED ORDER — BLOOD GLUCOSE MONITORING SUPPL DEVI: 1.0000 | Freq: Three times a day (TID) | 0 refills | Status: AC

## 2022-11-08 MED ORDER — TIRZEPATIDE 2.5 MG/0.5ML ~~LOC~~ SOAJ: 2.5000 mg | SUBCUTANEOUS | 0 refills | Status: DC

## 2022-11-08 MED ORDER — METFORMIN HCL 500 MG PO TABS: 1000.0000 mg | ORAL_TABLET | Freq: Two times a day (BID) | ORAL | 0 refills | Status: DC

## 2022-11-08 MED ORDER — LANCET DEVICE MISC
1.0000 | Freq: Three times a day (TID) | 0 refills | Status: AC
Start: 2022-11-08 — End: 2022-12-08

## 2022-11-08 MED ORDER — BLOOD GLUCOSE TEST VI STRP
1.0000 | ORAL_STRIP | Freq: Three times a day (TID) | 0 refills | Status: AC
Start: 2022-11-08 — End: 2022-12-08

## 2022-11-08 MED ORDER — LANCETS MISC. MISC
1.0000 | Freq: Three times a day (TID) | 0 refills | Status: AC
Start: 2022-11-08 — End: 2022-12-08

## 2022-11-08 NOTE — Assessment & Plan Note (Signed)
Patient reports ongoing depression exacerbated by recent life stressors.  Plan: Continue current psychiatric support methods. Referrals to Behavioral Therapy if patient agrees, declined today Monitor mood and offer supportive counseling.

## 2022-11-08 NOTE — Assessment & Plan Note (Signed)
Poorly controlled diabetes with A1C of 13.5, accompanied by significant peripheral neuropathy.  Plan: Restart Metformin 500mg  twice daily. Initiate Tirzepatide 2.5mg  weekly. Educate patient on dietary and lifestyle modifications. Referral to Endocrinology. Monitor blood sugars daily; patient to use a new glucometer and strips. Schedule follow-up in one month for medication evaluation and lab reviews.

## 2022-11-08 NOTE — Assessment & Plan Note (Signed)
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

## 2022-11-08 NOTE — Patient Instructions (Signed)
Restart Metformin: Take 1,000 mg twice daily as prescribed. Begin Tirzepatide: Administer 2.5 mg injection once a week. Monitor Blood Sugars: Check fasting blood sugars regularly and log results. Complete Fasting Lab Work: Schedule a fasting blood draw for 1 week from now to check cholesterol and other key markers. Diet and Lifestyle: Maintain a balanced lifestyle focusing on low carb foods and exercise

## 2022-11-08 NOTE — Assessment & Plan Note (Signed)
Symptoms resolved. Continue to monitor.

## 2022-11-08 NOTE — Progress Notes (Signed)
Assessment/Plan:   Problem List Items Addressed This Visit       Cardiovascular and Mediastinum   HYPERTENSION     Respiratory   OBSTRUCTIVE SLEEP APNEA     Endocrine   Type 2 diabetes mellitus with peripheral neuropathy (HCC) - Primary    Poorly controlled diabetes with A1C of 13.5, accompanied by significant peripheral neuropathy.  Plan: Restart Metformin 500mg  twice daily. Initiate Tirzepatide 2.5mg  weekly. Educate patient on dietary and lifestyle modifications. Referral to Endocrinology. Monitor blood sugars daily; patient to use a new glucometer and strips. Schedule follow-up in one month for medication evaluation and lab reviews.      Relevant Medications   metFORMIN (GLUCOPHAGE) 500 MG tablet   tirzepatide (MOUNJARO) 2.5 MG/0.5ML Pen   Blood Glucose Monitoring Suppl DEVI   Glucose Blood (BLOOD GLUCOSE TEST STRIPS) STRP   Lancet Device MISC   Lancets Misc. MISC   Other Relevant Orders   POCT glycosylated hemoglobin (Hb A1C) (Completed)   TSH   Lipid panel   CBC with Differential/Platelet   Comprehensive metabolic panel   Microalbumin / creatinine urine ratio   Urinalysis w microscopic + reflex cultur   Ambulatory referral to Endocrinology     Genitourinary   Pyelonephritis    Symptoms resolved.  Continue to monitor.        Other   MDD (major depressive disorder), recurrent episode, moderate (HCC)    Patient reports ongoing depression exacerbated by recent life stressors.  Plan: Continue current psychiatric support methods. Referrals to Behavioral Therapy if patient agrees, declined today Monitor mood and offer supportive counseling.      Pain of right hip   Alcohol use disorder, severe, in sustained remission, in controlled environment [facility])    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      Relevant Orders   Hepatitis B core antibody, total   Hepatitis B surface antibody,qualitative   Hepatitis B surface  antigen   Other Visit Diagnoses     Cyclothymic disorder with manic episode (HCC)       Screening for viral disease       Relevant Orders   HIV antibody (with reflex)   Hepatitis C Antibody   Hepatitis B core antibody, total   Hepatitis B surface antibody,qualitative   Hepatitis B surface antigen       Medications Discontinued During This Encounter  Medication Reason   metFORMIN (GLUCOPHAGE) 500 MG tablet    ipratropium (ATROVENT) 0.03 % nasal spray    albuterol (PROVENTIL HFA;VENTOLIN HFA) 108 (90 Base) MCG/ACT inhaler    loratadine (CLARITIN) 10 MG tablet    fexofenadine-pseudoephedrine (ALLEGRA-D 24) 180-240 MG 24 hr tablet    nitrofurantoin, macrocrystal-monohydrate, (MACROBID) 100 MG capsule     Return for DM, fasting labs in around 1 week.    Subjective:   Encounter date: 11/08/2022  [name] is a 52 y.o. female who has HEPATITIS C; Type 2 diabetes mellitus with peripheral neuropathy (HCC); HYPONATREMIA, CHRONIC; LEUKOCYTOSIS UNSPECIFIED; TOBACCO DEPENDENCE; MDD (major depressive disorder), recurrent episode, moderate (HCC); OBSTRUCTIVE SLEEP APNEA; MIGRAINE, UNSPEC., W/O INTRACTABLE MIGRAINE; HYPERTENSION; ALLERGIC RHINITIS; IRRITABLE BOWEL SYNDROME; HEPATITIS NOS; BACK PAIN; SLEEP APNEA; RENAL CALCULUS, HX OF; Pain of right hip; Pyelonephritis; and Alcohol use disorder, severe, in sustained remission, in controlled environment (HCC) on their problem list..   She  has a past medical history of ALLERGIC RHINITIS, Allergy, Anemia, Anxiety, CHF (congestive heart failure) (HCC), DEPRESSION, DIABETES MELLITUS, TYPE II, HEPATITIS C, MRSA  infection, HYPERTENSION, HYPONATREMIA, CHRONIC, Irritable bowel syndrome, LEUKOCYTOSIS UNSPECIFIED, MIGRAINE, UNSPEC., W/O INTRACTABLE MIGRAINE, OSA (obstructive sleep apnea), Puncture wound of right foot, RENAL CALCULUS, HX OF, Sleep apnea, Substance abuse (HCC), TOBACCO DEPENDENCE, and Ulcer..   Chief Complaint: Establish care, diabetes  management, history of pyelonephritis 10/20/2022, recent tooth loss  History of Present Illness:  Diabetes Mellitus Type 2 with Peripheral Neuropathy. The patient presenting to establish care and manage her diabetes, which is complicated by peripheral neuropathy.  Hemoglobin A1C is 13.5. Past treatments included Metformin and Novo Insulin.  Currently not on a thing.  She voices concerns about kidney function while on Metformin due to prior kidney stones. The patient describes neuropathic pain as stabbing sensations and tingling in the feet.  Pyelonephritis. The patient had a severe kidney infection (pyelonephritis) diagnosed on 10/20/2022. A recent CT scan showed cortical hypoenhancement in the right kidney and air in the urinary bladder. She reports urinating frequently with sediment in her urine currently.  Overall symptoms are improved status post antibiotics.  Depression and Mental Health. The patient has a history of major depressive disorder and cyclothymic disorder. She relates significant life stressors, including being detained for a year, contributing to her depressive symptoms. She is vigilant about mood changes and has informed her support system actively.  Pain and Orthopedic Issues. The patient reports intermittent, severe right hip pain and chronic lower back pain. The patient has concern if the pain may be related both to pyelonephritis and a history of back problems since birth.     11/08/2022    2:28 PM 11/05/2015   12:31 PM 08/31/2015   10:52 AM 08/12/2015   11:09 AM 06/30/2015    2:37 PM  Depression screen PHQ 2/9  Decreased Interest 0 0 0 0 0  Down, Depressed, Hopeless 0 0 0 0 0  PHQ - 2 Score 0 0 0 0 0  Altered sleeping 3      Tired, decreased energy 3      Change in appetite 3      Feeling bad or failure about yourself  1      Trouble concentrating 1      Moving slowly or fidgety/restless 0      Suicidal thoughts 0      PHQ-9 Score 11      Difficult doing work/chores  Somewhat difficult          11/08/2022    2:29 PM  GAD 7 : Generalized Anxiety Score  Nervous, Anxious, on Edge 0  Control/stop worrying 0  Worry too much - different things 0  Trouble relaxing 3  Restless 0  Easily annoyed or irritable 1  Afraid - awful might happen 0  Total GAD 7 Score 4  Anxiety Difficulty Somewhat difficult      Review of Systems  Constitutional:  Negative for chills, diaphoresis, fever, malaise/fatigue and weight loss.  HENT:  Negative for congestion, ear discharge, ear pain and hearing loss.   Eyes:  Negative for blurred vision, double vision, photophobia, pain, discharge and redness.  Respiratory:  Negative for cough, sputum production, shortness of breath and wheezing.   Cardiovascular:  Negative for chest pain and palpitations.  Gastrointestinal:  Negative for abdominal pain, blood in stool, constipation, diarrhea, heartburn, melena, nausea and vomiting.  Genitourinary:  Positive for frequency and urgency. Negative for dysuria, flank pain and hematuria.  Musculoskeletal:  Positive for joint pain. Negative for myalgias.  Skin:  Negative for itching and rash.  Neurological:  Positive for  sensory change. Negative for dizziness, tingling, tremors, speech change, seizures, loss of consciousness, weakness and headaches.  Endo/Heme/Allergies:  Positive for polydipsia.  Psychiatric/Behavioral:  Negative for depression, hallucinations, memory loss, substance abuse and suicidal ideas. The patient does not have insomnia.   All other systems reviewed and are negative.   Past Surgical History:  Procedure Laterality Date   ABDOMINAL HYSTERECTOMY  02/20/2005   LIVER BIOPSY  02/20/1997   TUBAL LIGATION  02/20/1993    Outpatient Medications Prior to Visit  Medication Sig Dispense Refill   Aspirin-Caffeine (BC FAST PAIN RELIEF ARTHRITIS) 1000-65 MG PACK Take by mouth.     ibuprofen (ADVIL,MOTRIN) 200 MG tablet Take 800 mg by mouth every 6 (six) hours as needed for  pain.     albuterol (PROVENTIL HFA;VENTOLIN HFA) 108 (90 Base) MCG/ACT inhaler Inhale 2 puffs into the lungs every 6 (six) hours as needed for wheezing or shortness of breath (cough, shortness of breath or wheezing.). 1 Inhaler 1   fexofenadine-pseudoephedrine (ALLEGRA-D 24) 180-240 MG 24 hr tablet Take 1 tablet by mouth daily.     ipratropium (ATROVENT) 0.03 % nasal spray Place 2 sprays into both nostrils 2 (two) times daily. (Patient not taking: Reported on 11/05/2015) 30 mL 0   loratadine (CLARITIN) 10 MG tablet Take 10 mg by mouth daily.     metFORMIN (GLUCOPHAGE) 500 MG tablet Take 1 tablet (500 mg total) by mouth daily with breakfast. 30 tablet 3   nitrofurantoin, macrocrystal-monohydrate, (MACROBID) 100 MG capsule Take 1 capsule (100 mg total) by mouth 2 (two) times daily. 20 capsule 0   No facility-administered medications prior to visit.    Family History  Problem Relation Age of Onset   Diabetes Mother    Miscarriages / [place] Mother    Obesity Mother    Mental illness Father    Alcohol abuse Father    Anxiety disorder Sister    Depression Sister    Obesity Sister    Breast cancer Maternal Grandmother    Cancer Maternal Grandmother    Alcohol abuse Other        parents   Colon cancer Other        grandparent   Lung cancer Other        grandparent   Ovarian cancer Other        grandparent   Heart disease Other        parent   Alcohol abuse Maternal Grandfather    Hearing loss Paternal Grandfather    Anxiety disorder Sister    Obesity Sister     Social History   Socioeconomic History   Marital status: Divorced    Spouse name: n/a   Number of children: 1   Years of education: Not on file   Highest education level: Bachelor's degree (e.g., BA, AB, BS)  Occupational History   Occupation: Marine scientist  Tobacco Use   Smoking status: Former    Current packs/day: 0.00    Average packs/day: 1 pack/day for 25.0 years (25.0 ttl pk-yrs)    Types: Cigarettes     Quit date: 04/06/2017    Years since quitting: 5.5   Smokeless tobacco: Never  Vaping Use   Vaping status: Never Used  Substance and Sexual Activity   Alcohol use: No   Drug use: No   Sexual activity: Yes    Birth control/protection: None    Comment: I have no uterus, and am careful about STIs  Other Topics Concern   Not on file  Social History Narrative   Lives with 9 animals, and is currently fostering 3 kittens   Social Determinants of Health   Financial Resource Strain: Medium Risk (11/06/2022)   Overall Financial Resource Strain (CARDIA)    Difficulty of Paying Living Expenses: Somewhat hard  Food Insecurity: No Food Insecurity (11/06/2022)   Hunger Vital Sign    Worried About Running Out of Food in the Last Year: Never true    Ran Out of Food in the Last Year: Never true  Transportation Needs: No Transportation Needs (11/06/2022)   PRAPARE - Administrator, Civil Service (Medical): No    Lack of Transportation (Non-Medical): No  Physical Activity: Sufficiently Active (11/06/2022)   Exercise Vital Sign    Days of Exercise per Week: 3 days    Minutes of Exercise per Session: 150+ min  Stress: Stress Concern Present (11/06/2022)   [employer] of Occupational Health - Occupational Stress Questionnaire    Feeling of Stress : To some extent  Social Connections: Moderately Isolated (11/06/2022)   Social Connection and Isolation Panel [NHANES]    Frequency of Communication with Friends and Family: More than three times a week    Frequency of Social Gatherings with Friends and Family: Three times a week    Attends Religious Services: 1 to 4 times per year    Active Member of Clubs or Organizations: No    Attends Engineer, structural: Not on file    Marital Status: Divorced  Catering manager Violence: Not on file                                                                                                  Objective:  Physical Exam: BP 128/80 (BP  Location: Left Arm, Patient Position: Sitting, Cuff Size: Large)   Pulse 84   Temp (!) 97 F (36.1 C) (Temporal)   Ht 5' 4.25" (1.632 m)   Wt 216 lb 9.6 oz (98.2 kg)   SpO2 98%   BMI 36.89 kg/m     Physical Exam Constitutional:      General: She is not in acute distress.    Appearance: Normal appearance. She is not ill-appearing or toxic-appearing.  HENT:     Head: Normocephalic and atraumatic.     Nose: Nose normal. No congestion.  Eyes:     General: No scleral icterus.    Extraocular Movements: Extraocular movements intact.  Cardiovascular:     Rate and Rhythm: Normal rate and regular rhythm.     Pulses: Normal pulses.     Heart sounds: Normal heart sounds.  Pulmonary:     Effort: Pulmonary effort is normal. No respiratory distress.     Breath sounds: Normal breath sounds.  Abdominal:     General: Abdomen is flat. Bowel sounds are normal.     Palpations: Abdomen is soft.  Musculoskeletal:        General: Normal range of motion.  Lymphadenopathy:     Cervical: No cervical adenopathy.  Skin:    General: Skin is warm and dry.     Findings: No rash.  Neurological:     General: No focal deficit present.     Mental Status: She is alert and oriented to person, place, and time. Mental status is at baseline.  Psychiatric:        Mood and Affect: Mood normal.        Behavior: Behavior normal.        Thought Content: Thought content normal.        Judgment: Judgment normal.     CT ABDOMEN PELVIS W CONTRAST  Result Date: 10/20/2022 CLINICAL DATA:  Abdominal pain, acute, nonlocalized EXAM: CT ABDOMEN AND PELVIS WITH CONTRAST TECHNIQUE: Multidetector CT imaging of the abdomen and pelvis was performed using the standard protocol following bolus administration of intravenous contrast. RADIATION DOSE REDUCTION: This exam was performed according to the departmental dose-optimization program which includes automated exposure control, adjustment of the mA and/or kV according to  patient size and/or use of iterative reconstruction technique. CONTRAST:  OMNIPAQUE IOHEXOL 300 MG/ML  SOLN COMPARISON:  CT AP 06/06/12 FINDINGS: Lower chest: No acute abnormality. Hepatobiliary: Liver has a normal contour. No focal liver lesions are visualized. Portal veins are contrast opacify. No evidence of intra or extrahepatic biliary ductal dilatation. No evidence of cholelithiasis or cholecystitis. Pancreas: No evidence of peripancreatic fat stranding to suggest pancreatitis. No evidence of pancreatic ductal dilatation. Spleen: Normal in size without focal abnormality. Adrenals/Urinary Tract: Bilateral adrenal glands are normal in appearance. Bilateral kidneys are normal in size without evidence of hydronephrosis or nephrolithiasis. There are subtle regions of cortical hypoenhancement along the upper pole in the interpolar region of the right kidney (series 7, image 89), which are worrisome for pyelonephritis. The urinary bladder is fluid-filled with a small amount of air in the anti dependent portion, which can be seen in the setting of infection. Stomach/Bowel: No evidence of bowel obstruction. There is diverticulosis without evidence of diverticulitis. The appendix is normal in appearance Vascular/Lymphatic: No significant vascular findings are present. No enlarged abdominal or pelvic lymph nodes. Reproductive: Status post hysterectomy. No adnexal masses. Other: No abdominal wall hernia or abnormality. No abdominopelvic ascites. Musculoskeletal: No acute or significant osseous findings. IMPRESSION: 1. Subtle regions of cortical hypoenhancement along the upper pole and interpolar region of the right kidney are worrisome for pyelonephritis. Recommend correlation with urinalysis. 2. Small amount of air in the anti dependent portion of the urinary bladder, which can be seen in the setting of infection. Recommend correlation with urinalysis. Electronically Signed   By: Lorenza Cambridge M.D.   On: 10/20/2022  08:04    Recent Results (from the past 2160 hour(s))  Lipase, blood     Status: None   Collection Time: 10/20/22 12:46 AM  Result Value Ref Range   Lipase 27 11 - 51 U/L    Comment: Performed at [facility], [address], [place], [place] [address]  Comprehensive metabolic panel     Status: Abnormal   Collection Time: 10/20/22 12:46 AM  Result Value Ref Range   Sodium 132 (L) 135 - 145 mmol/L   Potassium 3.8 3.5 - 5.1 mmol/L   Chloride 101 98 - 111 mmol/L   CO2 21 (L) 22 - 32 mmol/L   Glucose, Bld 314 (H) 70 - 99 mg/dL    Comment: Glucose reference range applies only to samples taken after fasting for at least 8 hours.   BUN 12 6 - 20 mg/dL   Creatinine, Ser 6.96 0.44 - 1.00 mg/dL   Calcium 8.7 (L) 8.9 - 10.3  mg/dL   Total Protein 7.6 6.5 - 8.1 g/dL   Albumin 3.2 (L) 3.5 - 5.0 g/dL   AST 18 15 - 41 U/L   ALT 32 0 - 44 U/L   Alkaline Phosphatase 66 38 - 126 U/L   Total Bilirubin 0.9 0.3 - 1.2 mg/dL   GFR, Estimated >16 >10 mL/min    Comment: (NOTE) Calculated using the CKD-EPI Creatinine Equation (2021)    Anion gap 10 5 - 15    Comment: Performed at [facility], [address] [place], [place] [address]  CBC with Differential     Status: Abnormal   Collection Time: 10/20/22 12:46 AM  Result Value Ref Range   WBC 14.7 (H) 4.0 - 10.5 K/uL   RBC 4.81 3.87 - 5.11 MIL/uL   Hemoglobin 15.3 (H) 12.0 - 15.0 g/dL   HCT 40.9 81.1 - 91.4 %   MCV 92.1 80.0 - 100.0 fL   MCH 31.8 26.0 - 34.0 pg   MCHC 34.5 30.0 - 36.0 g/dL   RDW 78.2 95.6 - 21.3 %   Platelets 338 150 - 400 K/uL    Comment: REPEATED TO VERIFY   nRBC 0.0 0.0 - 0.2 %   Neutrophils Relative % 67 %   Neutro Abs 9.9 (H) 1.7 - 7.7 K/uL   Lymphocytes Relative 18 %   Lymphs Abs 2.7 0.7 - 4.0 K/uL   Monocytes Relative 13 %   Monocytes Absolute 2.0 (H) 0.1 - 1.0 K/uL   Eosinophils Relative 0 %   Eosinophils Absolute 0.0 0.0 - 0.5 K/uL   Basophils Relative 1 %   Basophils  Absolute 0.1 0.0 - 0.1 K/uL   Immature Granulocytes 1 %   Abs Immature Granulocytes 0.08 (H) 0.00 - 0.07 K/uL    Comment: Performed at [facility], [address] [place], [place] [address]  Urinalysis, Routine w reflex microscopic -Urine, Clean Catch     Status: Abnormal   Collection Time: 10/20/22 12:56 AM  Result Value Ref Range   Color, Urine YELLOW YELLOW   APPearance CLOUDY (A) CLEAR   Specific Gravity, Urine 1.012 1.005 - 1.030   pH 6.0 5.0 - 8.0   Glucose, UA >=500 (A) NEGATIVE mg/dL   Hgb urine dipstick NEGATIVE NEGATIVE   Bilirubin Urine NEGATIVE NEGATIVE   Ketones, ur 20 (A) NEGATIVE mg/dL   Protein, ur 30 (A) NEGATIVE mg/dL   Nitrite NEGATIVE NEGATIVE   Leukocytes,Ua LARGE (A) NEGATIVE   RBC / HPF 0-5 0 - 5 RBC/hpf   WBC, UA >50 0 - 5 WBC/hpf   Bacteria, UA MANY (A) NONE SEEN   Squamous Epithelial / HPF 11-20 0 - 5 /HPF   WBC Clumps PRESENT    Mucus PRESENT     Comment: Performed at [facility], [address] [place], [place] [address]  SARS Coronavirus 2 by RT PCR (hospital order, performed in [facility] hospital lab) *cepheid single result test* Anterior Nasal Swab     Status: None   Collection Time: 10/20/22  1:03 AM   Specimen: Anterior Nasal Swab  Result Value Ref Range   SARS Coronavirus 2 by RT PCR NEGATIVE NEGATIVE    Comment: (NOTE) SARS-CoV-2 target nucleic acids are NOT DETECTED.  The SARS-CoV-2 RNA is generally detectable in upper and lower respiratory specimens during the acute phase of infection. The lowest concentration of SARS-CoV-2 viral copies this assay can detect is 250 copies / mL. A negative result does not  preclude SARS-CoV-2 infection and should not be used as the sole basis for treatment or other patient management decisions.  A negative result may occur with improper specimen collection / handling, submission of specimen other than nasopharyngeal swab, presence of viral mutation(s) within  the areas targeted by this assay, and inadequate number of viral copies (<250 copies / mL). A negative result must be combined with clinical observations, patient history, and epidemiological information.  Fact Sheet for Patients:   RoadLapTop.co.za  Fact Sheet for Healthcare Providers: http://kim-miller.com/  This test is not yet approved or  cleared by the [place] FDA and has been authorized for detection and/or diagnosis of SARS-CoV-2 by FDA under an Emergency Use Authorization (EUA).  This EUA will remain in effect (meaning this test can be used) for the duration of the COVID-19 declaration under Section 564(b)(1) of the Act, 21 U.S.C. section 360bbb-3(b)(1), unless the authorization is terminated or revoked sooner.  Performed at [facility] [place] [facility], [address] [place], [place] [address]   Urine Culture     Status: Abnormal   Collection Time: 10/20/22  8:43 AM   Specimen: Urine, Clean Catch  Result Value Ref Range   Specimen Description      URINE, CLEAN CATCH Performed at [facility], [address] [place], [place] [address]    Special Requests      Immunocompromised Performed at [facility], [address], [place], [place] [address]    Culture >=100,000 COLONIES/mL ESCHERICHIA COLI (A)    Report Status 10/22/2022 FINAL    Organism ID, Bacteria ESCHERICHIA COLI (A)       Susceptibility   Escherichia coli - MIC*    AMPICILLIN <=2 SENSITIVE Sensitive     CEFAZOLIN <=4 SENSITIVE Sensitive     CEFEPIME <=0.12 SENSITIVE Sensitive     CEFTRIAXONE <=0.25 SENSITIVE Sensitive     CIPROFLOXACIN <=0.25 SENSITIVE Sensitive     GENTAMICIN <=1 SENSITIVE Sensitive     IMIPENEM <=0.25 SENSITIVE Sensitive     NITROFURANTOIN <=16 SENSITIVE Sensitive     TRIMETH/SULFA <=20 SENSITIVE Sensitive     AMPICILLIN/SULBACTAM <=2 SENSITIVE Sensitive     PIP/TAZO <=4 SENSITIVE  Sensitive     * >=100,000 COLONIES/mL ESCHERICHIA COLI  POCT glycosylated hemoglobin (Hb A1C)     Status: Abnormal   Collection Time: 11/08/22  2:53 PM  Result Value Ref Range   Hemoglobin A1C 13.5 (A) 4.0 - 5.6 %   HbA1c POC (<> result, manual entry)     HbA1c, POC (prediabetic range)     HbA1c, POC (controlled diabetic range)          Garner Nash, MD, MS

## 2022-11-09 LAB — MICROALBUMIN / CREATININE URINE RATIO
Creatinine,U: 43.2 mg/dL
Microalb Creat Ratio: 3.9 mg/g (ref 0.0–30.0)
Microalb, Ur: 1.7 mg/dL (ref 0.0–1.9)

## 2022-11-10 ENCOUNTER — Telehealth: Payer: Self-pay

## 2022-11-10 ENCOUNTER — Telehealth: Payer: Self-pay | Admitting: Family Medicine

## 2022-11-10 ENCOUNTER — Other Ambulatory Visit (HOSPITAL_COMMUNITY): Payer: Self-pay

## 2022-11-10 NOTE — Telephone Encounter (Signed)
Pharmacy Patient Advocate Encounter   Received notification from Physician's Office that prior authorization for [name] is required/requested.   Insurance verification completed.   The patient is insured through [facility] .   Per test claim: PA required; PA submitted to [facility] via CoverMyMeds Key/confirmation #/EOC Key: [id number] Status is pending

## 2022-11-10 NOTE — Telephone Encounter (Signed)
Patient stated that she drinks water frequently and liquid iv's so there's no way she was dehydrated. Declined repeat urine. I advised her that Dr. Janee Morn is out of the office, but will return on Monday to review the results. Patient stated she was upset she had to wait that long for results. I advised her that once he reviewed them he will send a mychart message or we would call. She stated she hoped she didn't have any symptoms over the weekend or go into sepsis. I advised patient that if her symptoms worsen over the weekend to go to ED.

## 2022-11-10 NOTE — Telephone Encounter (Signed)
Pt is wanting a cb concerning her most recent lab results. Please advise pt at [phone]

## 2022-11-11 LAB — URINALYSIS W MICROSCOPIC + REFLEX CULTURE
Bilirubin Urine: NEGATIVE
Hyaline Cast: NONE SEEN /LPF
Ketones, ur: NEGATIVE
Nitrites, Initial: NEGATIVE
Protein, ur: NEGATIVE
Specific Gravity, Urine: 1.038 — ABNORMAL HIGH (ref 1.001–1.035)
Squamous Epithelial / HPF: NONE SEEN /HPF (ref <=5)
WBC, UA: >=60 /HPF — AB (ref 0–5)
pH: 6 (ref 5.0–8.0)

## 2022-11-11 LAB — URINE CULTURE
MICRO NUMBER:: 15486505
SPECIMEN QUALITY:: ADEQUATE

## 2022-11-11 LAB — CULTURE INDICATED

## 2022-11-12 LAB — HIV ANTIBODY (ROUTINE TESTING W REFLEX): HIV 1&2 Ab, 4th Generation: NONREACTIVE

## 2022-11-12 LAB — HCV RNA,QUANTITATIVE REAL TIME PCR
HCV Quantitative Log: <1.18 Log IU/mL
HCV RNA, PCR, QN: <15 IU/mL

## 2022-11-12 LAB — HEPATITIS C ANTIBODY: Hepatitis C Ab: BORDERLINE — AB

## 2022-11-13 ENCOUNTER — Other Ambulatory Visit (HOSPITAL_COMMUNITY): Payer: Self-pay

## 2022-11-13 MED ORDER — CEPHALEXIN 500 MG PO CAPS
500.0000 mg | ORAL_CAPSULE | Freq: Four times a day (QID) | ORAL | 0 refills | Status: AC
Start: 2022-11-13 — End: 2022-11-27

## 2022-11-13 NOTE — Addendum Note (Signed)
Addended by: Fanny Bien B on: 11/13/2022 10:17 AM   Modules accepted: Orders

## 2022-11-13 NOTE — Telephone Encounter (Signed)
Contacted patient and provided results via result note. Patient verbalized understanding.

## 2022-11-13 NOTE — Telephone Encounter (Signed)
Pharmacy Patient Advocate Encounter  Received notification from [facility] that Prior Authorization for Greggory Keen  has been APPROVED from 9.20.24 to 9.20.25. Ran test claim, and The Rx has been recently filled.The Rx is payable again on/after 12/03/22.   This test claim was processed through [facility]- copay amounts may vary at other pharmacies due to pharmacy/plan contracts, or as the patient moves through the different stages of their insurance plan.   PA #/Case ID/Reference #: Key: Rona Ravens

## 2022-11-13 NOTE — Telephone Encounter (Signed)
Pt is calling in concerned, she feels she should be on medication for her abnormal lab results. Please advise pt at   [phone] [facility])

## 2022-11-17 ENCOUNTER — Ambulatory Visit: Payer: BC Managed Care – PPO

## 2022-11-17 ENCOUNTER — Other Ambulatory Visit: Payer: BC Managed Care – PPO

## 2022-11-17 DIAGNOSIS — E1142 Type 2 diabetes mellitus with diabetic polyneuropathy: Secondary | ICD-10-CM

## 2022-11-17 DIAGNOSIS — Z1159 Encounter for screening for other viral diseases: Secondary | ICD-10-CM

## 2022-11-17 DIAGNOSIS — F1021 Alcohol dependence, in remission: Secondary | ICD-10-CM

## 2022-11-17 DIAGNOSIS — R768 Other specified abnormal immunological findings in serum: Secondary | ICD-10-CM

## 2022-11-17 LAB — CBC WITH DIFFERENTIAL/PLATELET
Basophils Absolute: 0.1 10*3/uL (ref 0.0–0.1)
Basophils Relative: 0.7 % (ref 0.0–3.0)
Eosinophils Absolute: 0.4 10*3/uL (ref 0.0–0.7)
Eosinophils Relative: 3.1 % (ref 0.0–5.0)
HCT: 47.7 % — ABNORMAL HIGH (ref 36.0–46.0)
Hemoglobin: 15.9 g/dL — ABNORMAL HIGH (ref 12.0–15.0)
Lymphocytes Relative: 37.5 % (ref 12.0–46.0)
Lymphs Abs: 5 10*3/uL — ABNORMAL HIGH (ref 0.7–4.0)
MCHC: 33.2 g/dL (ref 30.0–36.0)
MCV: 93.6 fL (ref 78.0–100.0)
Monocytes Absolute: 1.1 10*3/uL — ABNORMAL HIGH (ref 0.1–1.0)
Monocytes Relative: 8 % (ref 3.0–12.0)
Neutro Abs: 6.7 10*3/uL (ref 1.4–7.7)
Neutrophils Relative %: 50.7 % (ref 43.0–77.0)
Platelets: 349 10*3/uL (ref 150.0–400.0)
RBC: 5.1 Mil/uL (ref 3.87–5.11)
RDW: 13.3 % (ref 11.5–15.5)
WBC: 13.3 10*3/uL — ABNORMAL HIGH (ref 4.0–10.5)

## 2022-11-17 LAB — COMPREHENSIVE METABOLIC PANEL
ALT: 80 U/L — ABNORMAL HIGH (ref 0–35)
AST: 56 U/L — ABNORMAL HIGH (ref 0–37)
Albumin: 3.8 g/dL (ref 3.5–5.2)
Alkaline Phosphatase: 74 U/L (ref 39–117)
BUN: 20 mg/dL (ref 6–23)
CO2: 24 meq/L (ref 19–32)
Calcium: 9.5 mg/dL (ref 8.4–10.5)
Chloride: 103 meq/L (ref 96–112)
Creatinine, Ser: 0.77 mg/dL (ref 0.40–1.20)
GFR: 87.63 mL/min (ref >60.00)
Glucose, Bld: 178 mg/dL — ABNORMAL HIGH (ref 70–99)
Potassium: 4.5 meq/L (ref 3.5–5.1)
Sodium: 138 meq/L (ref 135–145)
Total Bilirubin: 0.8 mg/dL (ref 0.2–1.2)
Total Protein: 7.5 g/dL (ref 6.0–8.3)

## 2022-11-17 LAB — LIPID PANEL
Cholesterol: 165 mg/dL (ref 0–200)
HDL: 52.1 mg/dL (ref >39.00)
LDL Cholesterol: 87 mg/dL (ref 0–99)
NonHDL: 112.43
Total CHOL/HDL Ratio: 3
Triglycerides: 128 mg/dL (ref 0.0–149.0)
VLDL: 25.6 mg/dL (ref 0.0–40.0)

## 2022-11-17 LAB — TSH: TSH: 0.66 u[IU]/mL (ref 0.35–5.50)

## 2022-11-18 LAB — HEPATITIS B CORE ANTIBODY, TOTAL: Hep B Core Total Ab: REACTIVE — AB

## 2022-11-18 LAB — HEPATITIS B SURFACE ANTIGEN: Hepatitis B Surface Ag: NONREACTIVE

## 2022-11-18 LAB — HEPATITIS B SURFACE ANTIBODY,QUALITATIVE: Hep B S Ab: NONREACTIVE

## 2022-11-20 NOTE — Progress Notes (Signed)
See lab result

## 2022-12-04 ENCOUNTER — Encounter: Payer: Self-pay | Admitting: Family Medicine

## 2022-12-04 ENCOUNTER — Ambulatory Visit: Payer: BC Managed Care – PPO | Admitting: Family Medicine

## 2022-12-04 VITALS — BP 128/82 | HR 76 | Temp 97.7°F | Wt 217.8 lb

## 2022-12-04 DIAGNOSIS — K754 Autoimmune hepatitis: Secondary | ICD-10-CM | POA: Diagnosis not present

## 2022-12-04 DIAGNOSIS — N10 Acute tubulo-interstitial nephritis: Secondary | ICD-10-CM | POA: Diagnosis not present

## 2022-12-04 DIAGNOSIS — R768 Other specified abnormal immunological findings in serum: Secondary | ICD-10-CM

## 2022-12-04 DIAGNOSIS — Z87442 Personal history of urinary calculi: Secondary | ICD-10-CM | POA: Diagnosis not present

## 2022-12-04 DIAGNOSIS — E1142 Type 2 diabetes mellitus with diabetic polyneuropathy: Secondary | ICD-10-CM

## 2022-12-04 DIAGNOSIS — Z7984 Long term (current) use of oral hypoglycemic drugs: Secondary | ICD-10-CM

## 2022-12-04 DIAGNOSIS — N12 Tubulo-interstitial nephritis, not specified as acute or chronic: Secondary | ICD-10-CM

## 2022-12-04 DIAGNOSIS — Z8619 Personal history of other infectious and parasitic diseases: Secondary | ICD-10-CM

## 2022-12-04 LAB — SEDIMENTATION RATE: Sed Rate: 59 mm/h — ABNORMAL HIGH (ref 0–30)

## 2022-12-04 LAB — HIGH SENSITIVITY CRP: CRP, High Sensitivity: 5.45 mg/L — ABNORMAL HIGH (ref 0.000–5.000)

## 2022-12-04 LAB — URIC ACID: Uric Acid, Serum: 5.2 mg/dL (ref 2.4–7.0)

## 2022-12-04 MED ORDER — TIRZEPATIDE 5 MG/0.5ML ~~LOC~~ SOAJ
5.0000 mg | SUBCUTANEOUS | 0 refills | Status: DC
Start: 2022-12-04 — End: 2022-12-14

## 2022-12-04 NOTE — Progress Notes (Unsigned)
Assessment/Plan:   Problem List Items Addressed This Visit   None   There are no discontinued medications.  No follow-ups on file.    Subjective:   Encounter date: 12/04/2022  [name] is a 52 y.o. female who has HEPATITIS C; Type 2 diabetes mellitus with peripheral neuropathy (HCC); HYPONATREMIA, CHRONIC; LEUKOCYTOSIS UNSPECIFIED; TOBACCO DEPENDENCE; MDD (major depressive disorder), recurrent episode, moderate (HCC); OBSTRUCTIVE SLEEP APNEA; MIGRAINE, UNSPEC., W/O INTRACTABLE MIGRAINE; HYPERTENSION; ALLERGIC RHINITIS; IRRITABLE BOWEL SYNDROME; HEPATITIS NOS; BACK PAIN; SLEEP APNEA; RENAL CALCULUS, HX OF; Pain of right hip; Pyelonephritis; and Alcohol use disorder, severe, in sustained remission, in controlled environment (HCC) on their problem list..   She  has a past medical history of ALLERGIC RHINITIS, Allergy, Anemia, Anxiety, CHF (congestive heart failure) (HCC), DEPRESSION, DIABETES MELLITUS, TYPE II, HEPATITIS C, MRSA infection, HYPERTENSION, HYPONATREMIA, CHRONIC, Irritable bowel syndrome, LEUKOCYTOSIS UNSPECIFIED, MIGRAINE, UNSPEC., W/O INTRACTABLE MIGRAINE, OSA (obstructive sleep apnea), Puncture wound of right foot, RENAL CALCULUS, HX OF, Sleep apnea, Substance abuse (HCC), TOBACCO DEPENDENCE, and Ulcer..   She presents with chief complaint of Medical Management of Chronic Issues (Discuss previous lab results and what's next) .   HPI:     ROS  Past Surgical History:  Procedure Laterality Date   ABDOMINAL HYSTERECTOMY  02/20/2005   LIVER BIOPSY  02/20/1997   TUBAL LIGATION  02/20/1993    Outpatient Medications Prior to Visit  Medication Sig Dispense Refill   Blood Glucose Monitoring Suppl DEVI 1 each by Does not apply route in the morning, at noon, and at bedtime. May substitute to any manufacturer covered by patient's insurance. 1 each 0   Glucose Blood (BLOOD GLUCOSE TEST STRIPS) STRP 1 each by In Vitro route in the morning, at noon, and at bedtime. May  substitute to any manufacturer covered by patient's insurance. 100 strip 0   ibuprofen (ADVIL,MOTRIN) 200 MG tablet Take 800 mg by mouth every 6 (six) hours as needed for pain.     Lancet Device MISC 1 each by Does not apply route in the morning, at noon, and at bedtime. May substitute to any manufacturer covered by patient's insurance. 1 each 0   Lancets Misc. MISC 1 each by Does not apply route in the morning, at noon, and at bedtime. May substitute to any manufacturer covered by patient's insurance. 100 each 0   metFORMIN (GLUCOPHAGE) 500 MG tablet Take 2 tablets (1,000 mg total) by mouth 2 (two) times daily with a meal. 360 tablet 0   tirzepatide (MOUNJARO) 2.5 MG/0.5ML Pen Inject 2.5 mg into the skin once a week. 2 mL 0   Aspirin-Caffeine (BC FAST PAIN RELIEF ARTHRITIS) 1000-65 MG PACK Take by mouth. (Patient not taking: Reported on 12/04/2022)     No facility-administered medications prior to visit.    Family History  Problem Relation Age of Onset   Diabetes Mother    Miscarriages / [place] Mother    Obesity Mother    Mental illness Father    Alcohol abuse Father    Anxiety disorder Sister    Depression Sister    Obesity Sister    Breast cancer Maternal Grandmother    Cancer Maternal Grandmother    Alcohol abuse Other        parents   Colon cancer Other        grandparent   Lung cancer Other        grandparent   Ovarian cancer Other        grandparent   Heart disease  Other        parent   Alcohol abuse Maternal Grandfather    Hearing loss Paternal Grandfather    Anxiety disorder Sister    Obesity Sister     Social History   Socioeconomic History   Marital status: Divorced    Spouse name: n/a   Number of children: 1   Years of education: Not on file   Highest education level: Bachelor's degree (e.g., BA, AB, BS)  Occupational History   Occupation: Marine scientist  Tobacco Use   Smoking status: Former    Current packs/day: 0.00    Average packs/day: 1  pack/day for 25.0 years (25.0 ttl pk-yrs)    Types: Cigarettes    Quit date: 04/06/2017    Years since quitting: 5.6   Smokeless tobacco: Never  Vaping Use   Vaping status: Never Used  Substance and Sexual Activity   Alcohol use: No   Drug use: No   Sexual activity: Yes    Birth control/protection: None    Comment: I have no uterus, and am careful about STIs  Other Topics Concern   Not on file  Social History Narrative   Lives with 9 animals, and is currently fostering 3 kittens   Social Determinants of Health   Financial Resource Strain: Low Risk  (12/01/2022)   Overall Financial Resource Strain (CARDIA)    Difficulty of Paying Living Expenses: Not very hard  Recent Concern: Financial Resource Strain - Medium Risk (11/06/2022)   Overall Financial Resource Strain (CARDIA)    Difficulty of Paying Living Expenses: Somewhat hard  Food Insecurity: No Food Insecurity (12/01/2022)   Hunger Vital Sign    Worried About Running Out of Food in the Last Year: Never true    Ran Out of Food in the Last Year: Never true  Transportation Needs: No Transportation Needs (12/01/2022)   PRAPARE - Administrator, Civil Service (Medical): No    Lack of Transportation (Non-Medical): No  Physical Activity: Inactive (12/01/2022)   Exercise Vital Sign    Days of Exercise per Week: 0 days    Minutes of Exercise per Session: 150+ min  Stress: Stress Concern Present (12/01/2022)   [employer] of Occupational Health - Occupational Stress Questionnaire    Feeling of Stress : To some extent  Social Connections: Moderately Isolated (12/01/2022)   Social Connection and Isolation Panel [NHANES]    Frequency of Communication with Friends and Family: More than three times a week    Frequency of Social Gatherings with Friends and Family: Once a week    Attends Religious Services: 1 to 4 times per year    Active Member of [employer] or Organizations: No    Attends Engineer, structural: Not  on file    Marital Status: Divorced  Catering manager Violence: Not on file                                                                                                  Objective:  Physical Exam: BP 128/82 (BP Location: Left Arm, Patient Position: Sitting, Cuff Size: Large)  Pulse 76   Temp 97.7 F (36.5 C) (Temporal)   Wt 217 lb 12.8 oz (98.8 kg)   SpO2 99%   BMI 37.09 kg/m    Wt Readings from Last 3 Encounters:  12/04/22 217 lb 12.8 oz (98.8 kg)  11/08/22 216 lb 9.6 oz (98.2 kg)  10/20/22 260 lb (117.9 kg)     Physical Exam  CT ABDOMEN PELVIS W CONTRAST  Result Date: 10/20/2022 CLINICAL DATA:  Abdominal pain, acute, nonlocalized EXAM: CT ABDOMEN AND PELVIS WITH CONTRAST TECHNIQUE: Multidetector CT imaging of the abdomen and pelvis was performed using the standard protocol following bolus administration of intravenous contrast. RADIATION DOSE REDUCTION: This exam was performed according to the departmental dose-optimization program which includes automated exposure control, adjustment of the mA and/or kV according to patient size and/or use of iterative reconstruction technique. CONTRAST:  OMNIPAQUE IOHEXOL 300 MG/ML  SOLN COMPARISON:  CT AP 06/06/12 FINDINGS: Lower chest: No acute abnormality. Hepatobiliary: Liver has a normal contour. No focal liver lesions are visualized. Portal veins are contrast opacify. No evidence of intra or extrahepatic biliary ductal dilatation. No evidence of cholelithiasis or cholecystitis. Pancreas: No evidence of peripancreatic fat stranding to suggest pancreatitis. No evidence of pancreatic ductal dilatation. Spleen: Normal in size without focal abnormality. Adrenals/Urinary Tract: Bilateral adrenal glands are normal in appearance. Bilateral kidneys are normal in size without evidence of hydronephrosis or nephrolithiasis. There are subtle regions of cortical hypoenhancement along the upper pole in the interpolar region of the right kidney  (series 7, image 89), which are worrisome for pyelonephritis. The urinary bladder is fluid-filled with a small amount of air in the anti dependent portion, which can be seen in the setting of infection. Stomach/Bowel: No evidence of bowel obstruction. There is diverticulosis without evidence of diverticulitis. The appendix is normal in appearance Vascular/Lymphatic: No significant vascular findings are present. No enlarged abdominal or pelvic lymph nodes. Reproductive: Status post hysterectomy. No adnexal masses. Other: No abdominal wall hernia or abnormality. No abdominopelvic ascites. Musculoskeletal: No acute or significant osseous findings. IMPRESSION: 1. Subtle regions of cortical hypoenhancement along the upper pole and interpolar region of the right kidney are worrisome for pyelonephritis. Recommend correlation with urinalysis. 2. Small amount of air in the anti dependent portion of the urinary bladder, which can be seen in the setting of infection. Recommend correlation with urinalysis. Electronically Signed   By: Lorenza Cambridge M.D.   On: 10/20/2022 08:04    Recent Results (from the past 2160 hour(s))  Lipase, blood     Status: None   Collection Time: 10/20/22 12:46 AM  Result Value Ref Range   Lipase 27 11 - 51 U/L    Comment: Performed at [facility], [address] [place], [place] [address]  Comprehensive metabolic panel     Status: Abnormal   Collection Time: 10/20/22 12:46 AM  Result Value Ref Range   Sodium 132 (L) 135 - 145 mmol/L   Potassium 3.8 3.5 - 5.1 mmol/L   Chloride 101 98 - 111 mmol/L   CO2 21 (L) 22 - 32 mmol/L   Glucose, Bld 314 (H) 70 - 99 mg/dL    Comment: Glucose reference range applies only to samples taken after fasting for at least 8 hours.   BUN 12 6 - 20 mg/dL   Creatinine, Ser 6.04 0.44 - 1.00 mg/dL   Calcium 8.7 (L) 8.9 - 10.3 mg/dL   Total Protein 7.6 6.5 - 8.1 g/dL   Albumin 3.2 (L) 3.5 -  5.0 g/dL   AST 18 15 - 41 U/L   ALT 32 0 - 44  U/L   Alkaline Phosphatase 66 38 - 126 U/L   Total Bilirubin 0.9 0.3 - 1.2 mg/dL   GFR, Estimated >16 >10 mL/min    Comment: (NOTE) Calculated using the CKD-EPI Creatinine Equation (2021)    Anion gap 10 5 - 15    Comment: Performed at [facility], [address], [place], [place] [address]  CBC with Differential     Status: Abnormal   Collection Time: 10/20/22 12:46 AM  Result Value Ref Range   WBC 14.7 (H) 4.0 - 10.5 K/uL   RBC 4.81 3.87 - 5.11 MIL/uL   Hemoglobin 15.3 (H) 12.0 - 15.0 g/dL   HCT 40.9 81.1 - 91.4 %   MCV 92.1 80.0 - 100.0 fL   MCH 31.8 26.0 - 34.0 pg   MCHC 34.5 30.0 - 36.0 g/dL   RDW 78.2 95.6 - 21.3 %   Platelets 338 150 - 400 K/uL    Comment: REPEATED TO VERIFY   nRBC 0.0 0.0 - 0.2 %   Neutrophils Relative % 67 %   Neutro Abs 9.9 (H) 1.7 - 7.7 K/uL   Lymphocytes Relative 18 %   Lymphs Abs 2.7 0.7 - 4.0 K/uL   Monocytes Relative 13 %   Monocytes Absolute 2.0 (H) 0.1 - 1.0 K/uL   Eosinophils Relative 0 %   Eosinophils Absolute 0.0 0.0 - 0.5 K/uL   Basophils Relative 1 %   Basophils Absolute 0.1 0.0 - 0.1 K/uL   Immature Granulocytes 1 %   Abs Immature Granulocytes 0.08 (H) 0.00 - 0.07 K/uL    Comment: Performed at [facility], [address], [place], [place] [address]  Urinalysis, Routine w reflex microscopic -Urine, Clean Catch     Status: Abnormal   Collection Time: 10/20/22 12:56 AM  Result Value Ref Range   Color, Urine YELLOW YELLOW   APPearance CLOUDY (A) CLEAR   Specific Gravity, Urine 1.012 1.005 - 1.030   pH 6.0 5.0 - 8.0   Glucose, UA >=500 (A) NEGATIVE mg/dL   Hgb urine dipstick NEGATIVE NEGATIVE   Bilirubin Urine NEGATIVE NEGATIVE   Ketones, ur 20 (A) NEGATIVE mg/dL   Protein, ur 30 (A) NEGATIVE mg/dL   Nitrite NEGATIVE NEGATIVE   Leukocytes,Ua LARGE (A) NEGATIVE   RBC / HPF 0-5 0 - 5 RBC/hpf   WBC, UA >50 0 - 5 WBC/hpf   Bacteria, UA MANY (A) NONE SEEN   Squamous Epithelial / HPF 11-20 0 - 5  /HPF   WBC Clumps PRESENT    Mucus PRESENT     Comment: Performed at [facility], [address], [place], [place] [address]  SARS Coronavirus 2 by RT PCR (hospital order, performed in [facility] hospital lab) *cepheid single result test* Anterior Nasal Swab     Status: None   Collection Time: 10/20/22  1:03 AM   Specimen: Anterior Nasal Swab  Result Value Ref Range   SARS Coronavirus 2 by RT PCR NEGATIVE NEGATIVE    Comment: (NOTE) SARS-CoV-2 target nucleic acids are NOT DETECTED.  The SARS-CoV-2 RNA is generally detectable in upper and lower respiratory specimens during the acute phase of infection. The lowest concentration of SARS-CoV-2 viral copies this assay can detect is 250 copies / mL. A negative result does not preclude SARS-CoV-2 infection and should not be used as the sole basis for treatment or other  patient management decisions.  A negative result may occur with improper specimen collection / handling, submission of specimen other than nasopharyngeal swab, presence of viral mutation(s) within the areas targeted by this assay, and inadequate number of viral copies (<250 copies / mL). A negative result must be combined with clinical observations, patient history, and epidemiological information.  Fact Sheet for Patients:   RoadLapTop.co.za  Fact Sheet for Healthcare Providers: http://kim-miller.com/  This test is not yet approved or  cleared by the [place] FDA and has been authorized for detection and/or diagnosis of SARS-CoV-2 by FDA under an Emergency Use Authorization (EUA).  This EUA will remain in effect (meaning this test can be used) for the duration of the COVID-19 declaration under Section 564(b)(1) of the Act, 21 U.S.C. section 360bbb-3(b)(1), unless the authorization is terminated or revoked sooner.  Performed at [facility], [address], [place], [place]  [address]   Urine Culture     Status: Abnormal   Collection Time: 10/20/22  8:43 AM   Specimen: Urine, Clean Catch  Result Value Ref Range   Specimen Description      URINE, CLEAN CATCH Performed at [facility], [address] [place], [place] [address]    Special Requests      Immunocompromised Performed at [facility], [address], [place], [place] [address]    Culture >=100,000 COLONIES/mL ESCHERICHIA COLI (A)    Report Status 10/22/2022 FINAL    Organism ID, Bacteria ESCHERICHIA COLI (A)       Susceptibility   Escherichia coli - MIC*    AMPICILLIN <=2 SENSITIVE Sensitive     CEFAZOLIN <=4 SENSITIVE Sensitive     CEFEPIME <=0.12 SENSITIVE Sensitive     CEFTRIAXONE <=0.25 SENSITIVE Sensitive     CIPROFLOXACIN <=0.25 SENSITIVE Sensitive     GENTAMICIN <=1 SENSITIVE Sensitive     IMIPENEM <=0.25 SENSITIVE Sensitive     NITROFURANTOIN <=16 SENSITIVE Sensitive     TRIMETH/SULFA <=20 SENSITIVE Sensitive     AMPICILLIN/SULBACTAM <=2 SENSITIVE Sensitive     PIP/TAZO <=4 SENSITIVE Sensitive     * >=100,000 COLONIES/mL ESCHERICHIA COLI  POCT glycosylated hemoglobin (Hb A1C)     Status: Abnormal   Collection Time: 11/08/22  2:53 PM  Result Value Ref Range   Hemoglobin A1C 13.5 (A) 4.0 - 5.6 %   HbA1c POC (<> result, manual entry)     HbA1c, POC (prediabetic range)     HbA1c, POC (controlled diabetic range)    HIV antibody (with reflex)     Status: None   Collection Time: 11/08/22  3:19 PM  Result Value Ref Range   HIV 1&2 Ab, 4th Generation NON-REACTIVE NON-REACTIVE    Comment: HIV-1 antigen and HIV-1/HIV-2 antibodies were not detected. There is no laboratory evidence of HIV infection. Marland Kitchen PLEASE NOTE: This information has been disclosed to you from records whose confidentiality may be protected by state law.  If your state requires such protection, then the state law prohibits you from making any further disclosure of the  information without the specific written consent of the person to whom it pertains, or as otherwise permitted by law. A general authorization for the release of medical or other information is NOT sufficient for this purpose. . For additional information please refer to http://education.questdiagnostics.com/faq/FAQ106 (This link is being provided for informational/ educational purposes only.) . Marland Kitchen The performance of this assay has not been clinically validated in patients less than 23 years old. Marland Kitchen  Hepatitis C Antibody     Status: Abnormal   Collection Time: 11/08/22  3:19 PM  Result Value Ref Range   Hepatitis C Ab BORDERLINE (A) NON-REACTIVE    Comment: Verified by repeat analysis. . . Based on this result, the sample will be tested for HCV RNA by a Nucleic Acid Amplification Test (NAAT) to determine if the patient has a current active infection. Marland Kitchen   HCV RNA, Quantitative Real Time PCR     Status: None   Collection Time: 11/08/22  3:19 PM  Result Value Ref Range   HCV RNA, PCR, QN <15 NOT DETECTED NOT DETECTED IU/mL   HCV Quantitative Log <1.18 NOT DETECTED NOT DETECTED Log IU/mL    Comment: . HCV RNA is not detected.  There is no laboratory  evidence of a current active HCV infection.  . This pattern of results (undetectable HCV RNA combined with reactive HCV antibody) could be consistent with a resolved past infection if the clinical history is  compatible with previous HCV exposure.  However, if no previous exposure is suspected, the reactive HCV  antibody could be a biological false positive result. . For more information on this test, go to: http://education.questdiagnostics.com/faq/FAQ22v1 (This link is being provided for informational/ educational purposes only.) . This assay is intended for use as an aid in the diagnosis of HCV infection and the management of HCV infected patients undergoing anti-viral therapy. .   Microalbumin / creatinine urine ratio      Status: None   Collection Time: 11/08/22  3:20 PM  Result Value Ref Range   Microalb, Ur 1.7 0.0 - 1.9 mg/dL   Creatinine,U 16.1 mg/dL   Microalb Creat Ratio 3.9 0.0 - 30.0 mg/g  Urinalysis w microscopic + reflex cultur     Status: Abnormal   Collection Time: 11/08/22  3:20 PM   Specimen: Urine  Result Value Ref Range   Color, Urine YELLOW YELLOW   APPearance TURBID (A) CLEAR   Specific Gravity, Urine 1.038 (H) 1.001 - 1.035   pH 6.0 5.0 - 8.0   Glucose, UA 3+ (A) NEGATIVE   Bilirubin Urine NEGATIVE NEGATIVE   Ketones, ur NEGATIVE NEGATIVE   Hgb urine dipstick TRACE (A) NEGATIVE   Protein, ur NEGATIVE NEGATIVE   Nitrites, Initial NEGATIVE NEGATIVE   Leukocyte Esterase 2+ (A) NEGATIVE   WBC, UA > OR = 60 (A) 0 - 5 /HPF   RBC / HPF 0-2 0 - 2 /HPF   Squamous Epithelial / HPF NONE SEEN < OR = 5 /HPF   Bacteria, UA MANY (A) NONE SEEN /HPF   Hyaline Cast NONE SEEN NONE SEEN /LPF   Note      Comment: This urine was analyzed for the presence of WBC,  RBC, bacteria, casts, and other formed elements.  Only those elements seen were reported. . .   Urine Culture     Status: Abnormal   Collection Time: 11/08/22  3:20 PM  Result Value Ref Range   MICRO NUMBER: [id number]    SPECIMEN QUALITY: Adequate    Sample Source URINE    STATUS: FINAL    ISOLATE 1: Escherichia coli (A)     Comment: Greater than 100,000 CFU/mL of Escherichia coli      Susceptibility   Escherichia coli - URINE CULTURE, REFLEX    AMOX/CLAVULANIC <=2 Sensitive     AMPICILLIN <=2 Sensitive     AMPICILLIN/SULBACTAM <=2 Sensitive     CEFAZOLIN* <=4 Not Reportable      *  For infections other than uncomplicated UTI caused by E. coli, K. pneumoniae or P. mirabilis: Cefazolin is resistant if MIC > or = 8 mcg/mL. (Distinguishing susceptible versus intermediate for isolates with MIC < or = 4 mcg/mL requires additional testing.) For uncomplicated UTI caused by E. coli, K. pneumoniae or P. mirabilis: Cefazolin  is susceptible if MIC <32 mcg/mL and predicts susceptible to the oral agents cefaclor, cefdinir, cefpodoxime, cefprozil, cefuroxime, cephalexin and loracarbef.     CEFTAZIDIME <=1 Sensitive     CEFEPIME <=1 Sensitive     CEFTRIAXONE <=1 Sensitive     CIPROFLOXACIN <=0.25 Sensitive     LEVOFLOXACIN <=0.12 Sensitive     GENTAMICIN <=1 Sensitive     IMIPENEM <=0.25 Sensitive     NITROFURANTOIN <=16 Sensitive     PIP/TAZO <=4 Sensitive     TOBRAMYCIN <=1 Sensitive     TRIMETH/SULFA* <=20 Sensitive      * For infections other than uncomplicated UTI caused by E. coli, K. pneumoniae or P. mirabilis: Cefazolin is resistant if MIC > or = 8 mcg/mL. (Distinguishing susceptible versus intermediate for isolates with MIC < or = 4 mcg/mL requires additional testing.) For uncomplicated UTI caused by E. coli, K. pneumoniae or P. mirabilis: Cefazolin is susceptible if MIC <32 mcg/mL and predicts susceptible to the oral agents cefaclor, cefdinir, cefpodoxime, cefprozil, cefuroxime, cephalexin and loracarbef. Legend: S = Susceptible  I = Intermediate R = Resistant  NS = Not susceptible SDD = Susceptible Dose Dependent * = Not Tested  NR = Not Reported **NN = See Therapy Comments   REFLEXIVE URINE CULTURE     Status: None   Collection Time: 11/08/22  3:20 PM  Result Value Ref Range   REFLEXIVE URINE CULTURE      Comment: CULTURE INDICATED - RESULTS TO FOLLOW  Hepatitis B surface antigen     Status: None   Collection Time: 11/17/22  8:57 AM  Result Value Ref Range   Hepatitis B Surface Ag NON-REACTIVE NON-REACTIVE    Comment: . For additional information, please refer to  http://education.questdiagnostics.com/faq/FAQ202  (This link is being provided for informational/ educational purposes only.) .   Hepatitis B surface antibody,qualitative     Status: None   Collection Time: 11/17/22  8:57 AM  Result Value Ref Range   Hep B S Ab NON-REACTIVE NON-REACTIVE  Hepatitis B core antibody,  total     Status: Abnormal   Collection Time: 11/17/22  8:57 AM  Result Value Ref Range   Hep B Core Total Ab REACTIVE (A) NON-REACTIVE    Comment: . For additional information, please refer to  http://education.questdiagnostics.com/faq/FAQ202  (This link is being provided for informational/ educational purposes only.) .   Comprehensive metabolic panel     Status: Abnormal   Collection Time: 11/17/22  8:57 AM  Result Value Ref Range   Sodium 138 135 - 145 mEq/L   Potassium 4.5 3.5 - 5.1 mEq/L   Chloride 103 96 - 112 mEq/L   CO2 24 19 - 32 mEq/L   Glucose, Bld 178 (H) 70 - 99 mg/dL   BUN 20 6 - 23 mg/dL   Creatinine, Ser 7.82 0.40 - 1.20 mg/dL   Total Bilirubin 0.8 0.2 - 1.2 mg/dL   Alkaline Phosphatase 74 39 - 117 U/L   AST 56 (H) 0 - 37 U/L   ALT 80 (H) 0 - 35 U/L   Total Protein 7.5 6.0 - 8.3 g/dL   Albumin 3.8 3.5 - 5.2 g/dL   GFR  87.63 >60.00 mL/min    Comment: Calculated using the CKD-EPI Creatinine Equation (2021)   Calcium 9.5 8.4 - 10.5 mg/dL  CBC with Differential/Platelet     Status: Abnormal   Collection Time: 11/17/22  8:57 AM  Result Value Ref Range   WBC 13.3 (H) 4.0 - 10.5 K/uL   RBC 5.10 3.87 - 5.11 Mil/uL   Hemoglobin 15.9 (H) 12.0 - 15.0 g/dL   HCT 01.0 (H) 27.2 - 53.6 %   MCV 93.6 78.0 - 100.0 fl   MCHC 33.2 30.0 - 36.0 g/dL   RDW 64.4 03.4 - 74.2 %   Platelets 349.0 150.0 - 400.0 K/uL   Neutrophils Relative % 50.7 43.0 - 77.0 %   Lymphocytes Relative 37.5 12.0 - 46.0 %   Monocytes Relative 8.0 3.0 - 12.0 %   Eosinophils Relative 3.1 0.0 - 5.0 %   Basophils Relative 0.7 0.0 - 3.0 %   Neutro Abs 6.7 1.4 - 7.7 K/uL   Lymphs Abs 5.0 (H) 0.7 - 4.0 K/uL   Monocytes Absolute 1.1 (H) 0.1 - 1.0 K/uL   Eosinophils Absolute 0.4 0.0 - 0.7 K/uL   Basophils Absolute 0.1 0.0 - 0.1 K/uL  Lipid panel     Status: None   Collection Time: 11/17/22  8:57 AM  Result Value Ref Range   Cholesterol 165 0 - 200 mg/dL    Comment: ATP III Classification       Desirable:   < 200 mg/dL               Borderline High:  200 - 239 mg/dL          High:  > = 595 mg/dL   Triglycerides 638.7 0.0 - 149.0 mg/dL    Comment: Normal:  <564 mg/dLBorderline High:  150 - 199 mg/dL   HDL 33.29 >51.88 mg/dL   VLDL 41.6 0.0 - 60.6 mg/dL   LDL Cholesterol 87 0 - 99 mg/dL   Total CHOL/HDL Ratio 3     Comment:                Men          Women1/2 Average Risk     3.4          3.3Average Risk          5.0          4.42X Average Risk          9.6          7.13X Average Risk          15.0          11.0                       NonHDL 112.43     Comment: NOTE:  Non-HDL goal should be 30 mg/dL higher than patient's LDL goal (i.e. LDL goal of < 70 mg/dL, would have non-HDL goal of < 100 mg/dL)  TSH     Status: None   Collection Time: 11/17/22  8:57 AM  Result Value Ref Range   TSH 0.66 0.35 - 5.50 uIU/mL        Garner Nash, MD, MS

## 2022-12-05 ENCOUNTER — Encounter: Payer: Self-pay | Admitting: Urology

## 2022-12-05 ENCOUNTER — Ambulatory Visit: Payer: BC Managed Care – PPO | Admitting: Urology

## 2022-12-05 VITALS — BP 138/84 | HR 76 | Ht 64.0 in | Wt 216.0 lb

## 2022-12-05 DIAGNOSIS — R300 Dysuria: Secondary | ICD-10-CM | POA: Diagnosis not present

## 2022-12-05 DIAGNOSIS — R350 Frequency of micturition: Secondary | ICD-10-CM | POA: Diagnosis not present

## 2022-12-05 DIAGNOSIS — Z8619 Personal history of other infectious and parasitic diseases: Secondary | ICD-10-CM | POA: Insufficient documentation

## 2022-12-05 DIAGNOSIS — Z87440 Personal history of urinary (tract) infections: Secondary | ICD-10-CM

## 2022-12-05 DIAGNOSIS — N390 Urinary tract infection, site not specified: Secondary | ICD-10-CM | POA: Insufficient documentation

## 2022-12-05 LAB — URINALYSIS, ROUTINE W REFLEX MICROSCOPIC
Bilirubin, UA: NEGATIVE
Glucose, UA: NEGATIVE
Ketones, UA: NEGATIVE
Nitrite, UA: NEGATIVE
Protein,UA: NEGATIVE
Specific Gravity, UA: 1.02 (ref 1.005–1.030)
Urobilinogen, Ur: 1 mg/dL (ref 0.2–1.0)
pH, UA: 7 (ref 5.0–7.5)

## 2022-12-05 LAB — MICROSCOPIC EXAMINATION: WBC, UA: >30 /[HPF] — AB (ref 0–5)

## 2022-12-05 LAB — HEPATITIS B CORE AB W/REFLEX: Hep B Core Total Ab: NEGATIVE

## 2022-12-05 MED ORDER — CIPROFLOXACIN HCL 500 MG PO TABS
500.0000 mg | ORAL_TABLET | Freq: Two times a day (BID) | ORAL | 0 refills | Status: AC
Start: 2022-12-05 — End: 2022-12-10

## 2022-12-05 NOTE — Assessment & Plan Note (Signed)
Poorly controlled diabetes, proving  Plan: Continue metformin 1000 mg twice daily. Increase tirzepatide 5 mg weekly. Educate patient on dietary and lifestyle modifications. Follow-up in 1 month

## 2022-12-05 NOTE — Assessment & Plan Note (Signed)
Elevated LFTs  Past history of autoimmune hepatitis and hepatitis C (RNA negative). Hepatitis B core antibody positive, hepatitis B surface antigen antibody negative 09/2022 CT abdomen pelvis no signs of liver masses or steatosis Plan to perform fib-4 scoring, repeat hepatitis B core antibody for confirmation, CRP, and ESR Referral to gastroenterology

## 2022-12-05 NOTE — Assessment & Plan Note (Signed)
Last treated with cephalexin, now showing signs of recurrent and possible persistent infection. Referral to urology for further evaluation. Uric acid level to be to rule out non-radiopaque uric acid nephrolithiasis

## 2022-12-05 NOTE — Progress Notes (Signed)
Assessment: 1. Frequent UTI     Plan: I personally reviewed the patient's chart including provider notes, lab and imaging results. I personally reviewed the CT study from 09/23/2022 with results as noted below. Urine culture from 12/04/2022 pending. Recommend beginning treatment with Cipro 500 mg twice daily x 5 days.  Prescription sent. Will likely need a daily prophylactic antibiotic given her frequent UTIs. Methods to reduce the risk of UTIs discussed including fluid intake, timed and double voiding, daily cranberry supplement, and daily probiotic. Recommend management of her constipation to improve bladder emptying and reduce the risk of UTIs. Return to office in 4 weeks.  Chief Complaint:  Chief Complaint  Patient presents with   Frequent UTI    History of Present Illness:  [name] is a 52 y.o. female who is seen in consultation from Garnette Gunner, MD for evaluation of UTI's. She reports a history of UTI symptoms for approximately 1 year.  She did not seek medical attention as she did not have insurance at that time. She presented to the emergency room on 10/20/2022 with abdominal pain, frequency and dysuria.  She was also having some low back pain.  No fevers or chills.  White cell count was elevated to 14.7. CT imaging showed no renal or ureteral calculi, no evidence of obstruction, subtle regions of cortical hypoenhancement in the right kidney consistent with pyelonephritis, small amount of air within the bladder. Urine culture results: 10/20/2022 >100 K E. coli, pansensitive - treated with Rocephin and Duracef 11/09/2022 greater than 100 K E. Coli - treated with Keflex 12/04/22 pending  [name] took antibiotics approximately 2 weeks ago.  She notes improvement in her symptoms while on the antibiotics.  Her symptoms returned shortly after completion of the antibiotic.  She is having some intermittent frequency and dysuria.  No recent gross hematuria.  She reports  noting some mucus in the urine.  She also reports passing "sand" in her urine recently.  She does report a prior history of kidney stones.  Past Medical History:  Past Medical History:  Diagnosis Date   ALLERGIC RHINITIS    Allergy    Anemia    Anxiety    CHF (congestive heart failure) (HCC)    DEPRESSION    DIABETES MELLITUS, TYPE II    HEPATITIS C    Hx MRSA infection    HYPERTENSION    HYPONATREMIA, CHRONIC    Irritable bowel syndrome    LEUKOCYTOSIS UNSPECIFIED    chronic s/p heme eval (odogwu 2010)   MIGRAINE, UNSPEC., W/O INTRACTABLE MIGRAINE    OSA (obstructive sleep apnea)    CPAP qhs   Puncture wound of right foot    penetrated 3/4 of an inch   RENAL CALCULUS, HX OF    Sleep apnea    Substance abuse (HCC)    TOBACCO DEPENDENCE    Ulcer     Past Surgical History:  Past Surgical History:  Procedure Laterality Date   ABDOMINAL HYSTERECTOMY  02/20/2005   LIVER BIOPSY  02/20/1997   TUBAL LIGATION  02/20/1993    Allergies:  Allergies  Allergen Reactions   Dilaudid [Hydromorphone]    Erythromycin Nausea And Vomiting    Stomach upset   Ginseng Nausea And Vomiting and Swelling   Mushroom Extract Complex Nausea And Vomiting   Penicillins Other (See Comments)    Childhood allergy; reaction unknown. Has tolerated cephalosporins without allergic reaction.   Pineapple Hives    unknown   Sulfa Antibiotics Hives and  Itching    itching   Other Hives and Rash    Walnut    Family History:  Family History  Problem Relation Age of Onset   Diabetes Mother    Miscarriages / Stillbirths Mother    Obesity Mother    Mental illness Father    Alcohol abuse Father    Anxiety disorder Sister    Depression Sister    Obesity Sister    Breast cancer Maternal Grandmother    Cancer Maternal Grandmother    Alcohol abuse Other        parents   Colon cancer Other        grandparent   Lung cancer Other        grandparent   Ovarian cancer Other        grandparent    Heart disease Other        parent   Alcohol abuse Maternal Grandfather    Hearing loss Paternal Grandfather    Anxiety disorder Sister    Obesity Sister     Social History:  Social History   Tobacco Use   Smoking status: Former    Current packs/day: 0.00    Average packs/day: 1 pack/day for 25.0 years (25.0 ttl pk-yrs)    Types: Cigarettes    Quit date: 04/06/2017    Years since quitting: 5.6   Smokeless tobacco: Never  Vaping Use   Vaping status: Never Used  Substance Use Topics   Alcohol use: No   Drug use: No    Review of symptoms:  Constitutional:  Negative for unexplained weight loss, night sweats, fever, chills ENT:  Negative for nose bleeds, sinus pain, painful swallowing CV:  Negative for chest pain, shortness of breath, exercise intolerance, palpitations, loss of consciousness Resp:  Negative for cough, wheezing, shortness of breath GI:  Negative for nausea, vomiting, diarrhea, bloody stools GU:  Positives noted in HPI; otherwise negative for gross hematuria, urinary incontinence Neuro:  Negative for seizures, poor balance, limb weakness, slurred speech Psych:  Negative for lack of energy, depression, anxiety Endocrine:  Negative for polydipsia, polyuria, symptoms of hypoglycemia (dizziness, hunger, sweating) Hematologic:  Negative for anemia, purpura, petechia, prolonged or excessive bleeding, use of anticoagulants  Allergic:  Negative for difficulty breathing or choking as a result of exposure to anything; no shellfish allergy; no allergic response (rash/itch) to materials, foods  Physical exam: BP 138/84   Pulse 76   Ht 5\' 4"  (1.626 m)   Wt 216 lb (98 kg)   BMI 37.08 kg/m  GENERAL APPEARANCE:  Well appearing, well developed, well nourished, NAD HEENT: Atraumatic, Normocephalic, oropharynx clear. NECK: Supple without lymphadenopathy or thyromegaly. LUNGS: Clear to auscultation bilaterally. HEART: Regular Rate and Rhythm without murmurs, gallops, or  rubs. ABDOMEN: Soft, non-tender, No Masses. EXTREMITIES: Moves all extremities well.  Without clubbing, cyanosis, or edema. NEUROLOGIC:  Alert and oriented x 3, normal gait, CN II-XII grossly intact.  MENTAL STATUS:  Appropriate. BACK:  Non-tender to palpation.  No CVAT SKIN:  Warm, dry and intact.    Results: U/A: >30 WBC, 0-2 RBC, few bacteria  PVR: 169 ml

## 2022-12-07 ENCOUNTER — Other Ambulatory Visit: Payer: Self-pay | Admitting: Urology

## 2022-12-07 ENCOUNTER — Telehealth: Payer: Self-pay

## 2022-12-07 DIAGNOSIS — N390 Urinary tract infection, site not specified: Secondary | ICD-10-CM

## 2022-12-07 LAB — URINALYSIS W MICROSCOPIC + REFLEX CULTURE
Bilirubin Urine: NEGATIVE
Glucose, UA: NEGATIVE
Hgb urine dipstick: NEGATIVE
Hyaline Cast: NONE SEEN /LPF
Ketones, ur: NEGATIVE
Nitrites, Initial: NEGATIVE
Protein, ur: NEGATIVE
Specific Gravity, Urine: 1.024 (ref 1.001–1.035)
WBC, UA: >=60 /[HPF] — AB (ref 0–5)
pH: <=5 (ref 5.0–8.0)

## 2022-12-07 LAB — URINE CULTURE
MICRO NUMBER:: 15592596
SPECIMEN QUALITY:: ADEQUATE

## 2022-12-07 LAB — CULTURE INDICATED

## 2022-12-07 MED ORDER — NITROFURANTOIN MONOHYD MACRO 100 MG PO CAPS
100.0000 mg | ORAL_CAPSULE | Freq: Every day | ORAL | 2 refills | Status: DC
Start: 2022-12-07 — End: 2023-03-20

## 2022-12-07 NOTE — Telephone Encounter (Signed)
Notified pt as advised, pt verbalized understanding.

## 2022-12-07 NOTE — Telephone Encounter (Signed)
-----   Message from Di Kindle sent at 12/07/2022  1:00 PM EDT ----- Please notify patient to complete Cipro as prescribed for UTI. I also sent in a rx for Macrobid for her to begin daily after she completes the Cipro.  This is to prevent another UTI. Keep f/u appt as scheduled.

## 2022-12-13 ENCOUNTER — Telehealth: Payer: Self-pay | Admitting: Family Medicine

## 2022-12-13 NOTE — Telephone Encounter (Signed)
FYI.Patient states that she has been nauseous and vomiting since starting the 5 mg mounjaro and would like to decrease or discuss other options. I advised her that we could schedule a f/u visit to discuss her concerns and management. She agreed to an appt tomorrow at 8:20 am with PCP.

## 2022-12-13 NOTE — Telephone Encounter (Signed)
Please give the patient a call . Pt said she need some adjustments for the medication tirzepatide [facility]) 5 MG/0.5ML Pen [[id number]]

## 2022-12-14 ENCOUNTER — Encounter: Payer: Self-pay | Admitting: Family Medicine

## 2022-12-14 ENCOUNTER — Ambulatory Visit: Payer: BC Managed Care – PPO | Admitting: Family Medicine

## 2022-12-14 VITALS — BP 130/78 | HR 93 | Temp 97.8°F | Wt 213.6 lb

## 2022-12-14 DIAGNOSIS — N390 Urinary tract infection, site not specified: Secondary | ICD-10-CM

## 2022-12-14 DIAGNOSIS — Z122 Encounter for screening for malignant neoplasm of respiratory organs: Secondary | ICD-10-CM

## 2022-12-14 DIAGNOSIS — Z7984 Long term (current) use of oral hypoglycemic drugs: Secondary | ICD-10-CM | POA: Diagnosis not present

## 2022-12-14 DIAGNOSIS — E1142 Type 2 diabetes mellitus with diabetic polyneuropathy: Secondary | ICD-10-CM | POA: Diagnosis not present

## 2022-12-14 MED ORDER — METOCLOPRAMIDE HCL 5 MG PO TABS
5.0000 mg | ORAL_TABLET | Freq: Three times a day (TID) | ORAL | 0 refills | Status: DC | PRN
Start: 2022-12-14 — End: 2023-01-16

## 2022-12-14 MED ORDER — TIRZEPATIDE 2.5 MG/0.5ML ~~LOC~~ SOAJ: 2.5000 mg | SUBCUTANEOUS | 0 refills | Status: DC

## 2022-12-14 MED ORDER — PIOGLITAZONE HCL 30 MG PO TABS
30.0000 mg | ORAL_TABLET | Freq: Every day | ORAL | 0 refills | Status: DC
Start: 2022-12-14 — End: 2023-01-16

## 2022-12-14 NOTE — Patient Instructions (Signed)
For diabetes,  Continue Metformin 1000 mg twice daily (BID). Discontinue Tirzepatide [facility]) due to severe nausea at 5 mg dose; plan to restart at 2.5 mg when financially feasible. Start Pioglitazone 30 mg once daily. Monitor blood sugar levels daily. For nausea, take Metoclopramide 5 mg every 8 hours as needed. Ensure to stay hydrated by drinking plenty of fluids.  For history of UTI, Continue Nitrofurantoin 100 mg daily for prophylaxis. Stay on cranberry supplements as recommended by urology. Continue to monitor for any urinary symptoms or signs of infection.  Referral and Screening:  Schedule an eye exam with ophthalmology. Plan for lung cancer screening due to smoking history.

## 2022-12-14 NOTE — Progress Notes (Signed)
Assessment/Plan:   Problem List Items Addressed This Visit       Endocrine   Type 2 diabetes mellitus with peripheral neuropathy (HCC)    Not well managed with the current medication due to side effects of tirzepatide 5 mg.   Plan: Continue metformin 1000 mg twice a day. Discontinue tirzepatide (Mounjaro) 5 mg. Prescribe pioglitazone 30 mg once a day for additional glycemic control. Consider resuming tirzepatide 2.5 mg when financially feasible. Trial metoclopramide 5 mg 3 times daily as needed nausea Encourage adequate hydration Follow-Up Recommendations: Reassess in one month or sooner if symptoms persist or worsen.      Relevant Medications   pioglitazone (ACTOS) 30 MG tablet   metoCLOPramide (REGLAN) 5 MG tablet   tirzepatide (MOUNJARO) 2.5 MG/0.5ML Pen   Other Relevant Orders   Ambulatory Referral Lung Cancer Screening [facility]   AMB Referral [facility] Care Management   Ambulatory referral to Ophthalmology     Genitourinary   Frequent UTI    Under management with prophylactic nitrofurantoin, cranberry supplements, and other supportive therapies. Plan: Continue current management. Monitor for urinary symptoms to ensure no underlying UTI is exacerbating gastrointestinal symptoms.      Other Visit Diagnoses     Screening for lung cancer    -  Primary   Relevant Orders   Ambulatory Referral Lung Cancer Screening [facility]       Medications Discontinued During This Encounter  Medication Reason   tirzepatide [facility]en) 5 MG/0.5ML Pen     Return in about 4 weeks (around 01/11/2023) for DM.    Subjective:   Encounter date: 12/14/2022  [name] is a 52 y.o. female who has Type 2 diabetes mellitus with peripheral neuropathy (HCC); HYPONATREMIA, CHRONIC; LEUKOCYTOSIS UNSPECIFIED; TOBACCO DEPENDENCE; MDD (major depressive disorder), recurrent episode, moderate (HCC); OBSTRUCTIVE SLEEP APNEA; MIGRAINE, UNSPEC., W/O INTRACTABLE MIGRAINE;  HYPERTENSION; ALLERGIC RHINITIS; IRRITABLE BOWEL SYNDROME; Autoimmune hepatitis (HCC); BACK PAIN; SLEEP APNEA; RENAL CALCULUS, HX OF; Pain of right hip; Pyelonephritis; Alcohol use disorder, severe, in sustained remission, in controlled environment (HCC); Frequent UTI; and History of hepatitis C on their problem list..   She  has a past medical history of ALLERGIC RHINITIS, Allergy, Anemia, Anxiety, CHF (congestive Herrera failure) (HCC), DEPRESSION, DIABETES MELLITUS, TYPE II, HEPATITIS C, MRSA infection, HYPERTENSION, HYPONATREMIA, CHRONIC, Irritable bowel syndrome, LEUKOCYTOSIS UNSPECIFIED, MIGRAINE, UNSPEC., W/O INTRACTABLE MIGRAINE, OSA (obstructive sleep apnea), Puncture wound of right foot, RENAL CALCULUS, HX OF, Sleep apnea, Substance abuse (HCC), TOBACCO DEPENDENCE, and Ulcer..   She presents with chief complaint of Medical Management of Chronic Issues (Since starting 5 mg of mounjaro patient has been nauseated and vomiting.  ) .  Patient reports significant nausea and inability to tolerate the newly prescribed Mounjaro (tirzepatide) 5 mg dose, leading them to take a day-and-a-half off work due to severe nausea and feeling like passing out. The patient also reports not being able to eat or drink properly due to nausea. The patient feels better on the 2.5 mg dose but has financial constraints preventing them from resuming it immediately. Blood sugars have recently been in the late 130s, down from 190-210 prior to this week. Patient currently experiences muscle spasms in their back, potentially due to recent frequent vomiting.  Fibrosis-4 (FIB-4) Index for Liver Fibrosis from [email or web address]  on 12/14/2022 ** All calculations should be rechecked by clinician prior to use **  RESULT SUMMARY: 0.95 points Advanced fibrosis excluded Approximate fibrosis stage: Ishak 0-1 (Sterling et al 2006)   INPUTS: Age --> 52  years AST --> 56 U/L ALT --> 84 U/L Platelet count --> 349  10/L  Low fibrosis  scores may be appropriate candidates for medical management and may not require liver biopsy if FIB-4 scores continue to stay low.    Past Surgical History:  Procedure Laterality Date   ABDOMINAL HYSTERECTOMY  02/20/2005   LIVER BIOPSY  02/20/1997   TUBAL LIGATION  02/20/1993    Outpatient Medications Prior to Visit  Medication Sig Dispense Refill   Blood Glucose Monitoring Suppl DEVI 1 each by Does not apply route in the morning, at noon, and at bedtime. May substitute to any manufacturer covered by patient's insurance. 1 each 0   ibuprofen (ADVIL,MOTRIN) 200 MG tablet Take 800 mg by mouth every 6 (six) hours as needed for pain.     metFORMIN (GLUCOPHAGE) 500 MG tablet Take 2 tablets (1,000 mg total) by mouth 2 (two) times daily with a meal. 360 tablet 0   nitrofurantoin, macrocrystal-monohydrate, (MACROBID) 100 MG capsule Take 1 capsule (100 mg total) by mouth daily. 30 capsule 2   Aspirin-Caffeine (BC FAST PAIN RELIEF ARTHRITIS) 1000-65 MG PACK Take by mouth. (Patient not taking: Reported on 12/04/2022)     tirzepatide [facility]) 5 MG/0.5ML Pen Inject 5 mg into the skin once a week. (Patient not taking: Reported on 12/14/2022) 2 mL 0   No facility-administered medications prior to visit.    Family History  Problem Relation Age of Onset   Diabetes Mother    Miscarriages / [place] Mother    Obesity Mother    Mental illness Father    Alcohol abuse Father    Anxiety disorder Sister    Depression Sister    Obesity Sister    Breast cancer Maternal Grandmother    Cancer Maternal Grandmother    Alcohol abuse Other        parents   Colon cancer Other        grandparent   Lung cancer Other        grandparent   Ovarian cancer Other        grandparent   Herrera disease Other        parent   Alcohol abuse Maternal Grandfather    Hearing loss Paternal Grandfather    Anxiety disorder Sister    Obesity Sister     Social History   Socioeconomic History   Marital status:  Divorced    Spouse name: n/a   Number of children: 1   Years of education: Not on file   Highest education level: Bachelor's degree (e.g., BA, AB, BS)  Occupational History   Occupation: Marine scientist  Tobacco Use   Smoking status: Former    Current packs/day: 0.00    Average packs/day: 1 pack/day for 25.0 years (25.0 ttl pk-yrs)    Types: Cigarettes    Quit date: 04/06/2017    Years since quitting: 5.6   Smokeless tobacco: Never  Vaping Use   Vaping status: Never Used  Substance and Sexual Activity   Alcohol use: No   Drug use: No   Sexual activity: Yes    Birth control/protection: None    Comment: I have no uterus, and am careful about STIs  Other Topics Concern   Not on file  Social History Narrative   Lives with 9 animals, and is currently fostering 3 kittens   Social Determinants of Health   Financial Resource Strain: Low Risk  (12/01/2022)   Overall Financial Resource Strain (CARDIA)    Difficulty of  Paying Living Expenses: Not very hard  Recent Concern: Financial Resource Strain - Medium Risk (11/06/2022)   Overall Financial Resource Strain (CARDIA)    Difficulty of Paying Living Expenses: Somewhat hard  Food Insecurity: No Food Insecurity (12/01/2022)   Hunger Vital Sign    Worried About Running Out of Food in the Last Year: Never true    Ran Out of Food in the Last Year: Never true  Transportation Needs: No Transportation Needs (12/01/2022)   PRAPARE - Administrator, Civil Service (Medical): No    Lack of Transportation (Non-Medical): No  Physical Activity: Inactive (12/01/2022)   Exercise Vital Sign    Days of Exercise per Week: 0 days    Minutes of Exercise per Session: 150+ min  Stress: Stress Concern Present (12/01/2022)   [employer] of Occupational Health - Occupational Stress Questionnaire    Feeling of Stress : To some extent  Social Connections: Moderately Isolated (12/01/2022)   Social Connection and Isolation Panel [NHANES]     Frequency of Communication with Friends and Family: More than three times a week    Frequency of Social Gatherings with Friends and Family: Once a week    Attends Religious Services: 1 to 4 times per year    Active Member of [employer] or Organizations: No    Attends Engineer, structural: Not on file    Marital Status: Divorced  Catering manager Violence: Not on file                                                                                                  Objective:  Physical Exam: BP 130/78 (BP Location: Left Arm, Patient Position: Sitting, Cuff Size: Large)   Pulse 93   Temp 97.8 F (36.6 C) (Temporal)   Wt 213 lb 9.6 oz (96.9 kg)   SpO2 99%   BMI 36.66 kg/m    Wt Readings from Last 3 Encounters:  12/14/22 213 lb 9.6 oz (96.9 kg)  12/05/22 216 lb (98 kg)  12/04/22 217 lb 12.8 oz (98.8 kg)     Physical Exam Constitutional:      General: She is not in acute distress.    Appearance: Normal appearance. She is ill-appearing. She is not toxic-appearing.  HENT:     Head: Normocephalic and atraumatic.     Nose: Nose normal. No congestion.  Eyes:     General: No scleral icterus.    Extraocular Movements: Extraocular movements intact.  Cardiovascular:     Rate and Rhythm: Normal rate and regular rhythm.     Pulses: Normal pulses.     Herrera sounds: Normal Herrera sounds.  Pulmonary:     Effort: Pulmonary effort is normal. No respiratory distress.     Breath sounds: Normal breath sounds.  Abdominal:     General: Abdomen is flat. Bowel sounds are normal.     Palpations: Abdomen is soft.     Tenderness: There is no abdominal tenderness. There is no right CVA tenderness or left CVA tenderness.  Musculoskeletal:        General: Normal range of  motion.  Lymphadenopathy:     Cervical: No cervical adenopathy.  Skin:    General: Skin is warm and dry.     Findings: No rash.  Neurological:     General: No focal deficit present.     Mental Status: She is alert and  oriented to person, place, and time. Mental status is at baseline.  Psychiatric:        Mood and Affect: Mood normal.        Behavior: Behavior normal.        Thought Content: Thought content normal.        Judgment: Judgment normal.     CT ABDOMEN PELVIS W CONTRAST  Result Date: 10/20/2022 CLINICAL DATA:  Abdominal pain, acute, nonlocalized EXAM: CT ABDOMEN AND PELVIS WITH CONTRAST TECHNIQUE: Multidetector CT imaging of the abdomen and pelvis was performed using the standard protocol following bolus administration of intravenous contrast. RADIATION DOSE REDUCTION: This exam was performed according to the departmental dose-optimization program which includes automated exposure control, adjustment of the mA and/or kV according to patient size and/or use of iterative reconstruction technique. CONTRAST:  OMNIPAQUE IOHEXOL 300 MG/ML  SOLN COMPARISON:  CT AP 06/06/12 FINDINGS: Lower chest: No acute abnormality. Hepatobiliary: Liver has a normal contour. No focal liver lesions are visualized. Portal veins are contrast opacify. No evidence of intra or extrahepatic biliary ductal dilatation. No evidence of cholelithiasis or cholecystitis. Pancreas: No evidence of peripancreatic fat stranding to suggest pancreatitis. No evidence of pancreatic ductal dilatation. Spleen: Normal in size without focal abnormality. Adrenals/Urinary Tract: Bilateral adrenal glands are normal in appearance. Bilateral kidneys are normal in size without evidence of hydronephrosis or nephrolithiasis. There are subtle regions of cortical hypoenhancement along the upper pole in the interpolar region of the right kidney (series 7, image 89), which are worrisome for pyelonephritis. The urinary bladder is fluid-filled with a small amount of air in the anti dependent portion, which can be seen in the setting of infection. Stomach/Bowel: No evidence of bowel obstruction. There is diverticulosis without evidence of diverticulitis. The appendix  is normal in appearance Vascular/Lymphatic: No significant vascular findings are present. No enlarged abdominal or pelvic lymph nodes. Reproductive: Status post hysterectomy. No adnexal masses. Other: No abdominal wall hernia or abnormality. No abdominopelvic ascites. Musculoskeletal: No acute or significant osseous findings. IMPRESSION: 1. Subtle regions of cortical hypoenhancement along the upper pole and interpolar region of the right kidney are worrisome for pyelonephritis. Recommend correlation with urinalysis. 2. Small amount of air in the anti dependent portion of the urinary bladder, which can be seen in the setting of infection. Recommend correlation with urinalysis. Electronically Signed   By: Lorenza Cambridge M.D.   On: 10/20/2022 08:04    Recent Results (from the past 2160 hour(s))  Lipase, blood     Status: None   Collection Time: 10/20/22 12:46 AM  Result Value Ref Range   Lipase 27 11 - 51 U/L    Comment: Performed at [facility], [address], [place], [place] [address]  Comprehensive metabolic panel     Status: Abnormal   Collection Time: 10/20/22 12:46 AM  Result Value Ref Range   Sodium 132 (L) 135 - 145 mmol/L   Potassium 3.8 3.5 - 5.1 mmol/L   Chloride 101 98 - 111 mmol/L   CO2 21 (L) 22 - 32 mmol/L   Glucose, Bld 314 (H) 70 - 99 mg/dL    Comment: Glucose reference range applies only to samples taken after fasting  for at least 8 hours.   BUN 12 6 - 20 mg/dL   Creatinine, Ser 1.61 0.44 - 1.00 mg/dL   Calcium 8.7 (L) 8.9 - 10.3 mg/dL   Total Protein 7.6 6.5 - 8.1 g/dL   Albumin 3.2 (L) 3.5 - 5.0 g/dL   AST 18 15 - 41 U/L   ALT 32 0 - 44 U/L   Alkaline Phosphatase 66 38 - 126 U/L   Total Bilirubin 0.9 0.3 - 1.2 mg/dL   GFR, Estimated >09 >60 mL/min    Comment: (NOTE) Calculated using the CKD-EPI Creatinine Equation (2021)    Anion gap 10 5 - 15    Comment: Performed at [facility], [address] [place], [place] [address]  CBC  with Differential     Status: Abnormal   Collection Time: 10/20/22 12:46 AM  Result Value Ref Range   WBC 14.7 (H) 4.0 - 10.5 K/uL   RBC 4.81 3.87 - 5.11 MIL/uL   Hemoglobin 15.3 (H) 12.0 - 15.0 g/dL   HCT 81.1 91.4 - 78.2 %   MCV 92.1 80.0 - 100.0 fL   MCH 31.8 26.0 - 34.0 pg   MCHC 34.5 30.0 - 36.0 g/dL   RDW 95.6 21.3 - 08.6 %   Platelets 338 150 - 400 K/uL    Comment: REPEATED TO VERIFY   nRBC 0.0 0.0 - 0.2 %   Neutrophils Relative % 67 %   Neutro Abs 9.9 (H) 1.7 - 7.7 K/uL   Lymphocytes Relative 18 %   Lymphs Abs 2.7 0.7 - 4.0 K/uL   Monocytes Relative 13 %   Monocytes Absolute 2.0 (H) 0.1 - 1.0 K/uL   Eosinophils Relative 0 %   Eosinophils Absolute 0.0 0.0 - 0.5 K/uL   Basophils Relative 1 %   Basophils Absolute 0.1 0.0 - 0.1 K/uL   Immature Granulocytes 1 %   Abs Immature Granulocytes 0.08 (H) 0.00 - 0.07 K/uL    Comment: Performed at [facility], [address] [place], [place] [address]  Urinalysis, Routine w reflex microscopic -Urine, Clean Catch     Status: Abnormal   Collection Time: 10/20/22 12:56 AM  Result Value Ref Range   Color, Urine YELLOW YELLOW   APPearance CLOUDY (A) CLEAR   Specific Gravity, Urine 1.012 1.005 - 1.030   pH 6.0 5.0 - 8.0   Glucose, UA >=500 (A) NEGATIVE mg/dL   Hgb urine dipstick NEGATIVE NEGATIVE   Bilirubin Urine NEGATIVE NEGATIVE   Ketones, ur 20 (A) NEGATIVE mg/dL   Protein, ur 30 (A) NEGATIVE mg/dL   Nitrite NEGATIVE NEGATIVE   Leukocytes,Ua LARGE (A) NEGATIVE   RBC / HPF 0-5 0 - 5 RBC/hpf   WBC, UA >50 0 - 5 WBC/hpf   Bacteria, UA MANY (A) NONE SEEN   Squamous Epithelial / HPF 11-20 0 - 5 /HPF   WBC Clumps PRESENT    Mucus PRESENT     Comment: Performed at [facility], [address] [place], [place] [address]  SARS Coronavirus 2 by RT PCR (hospital order, performed in [facility] hospital lab) *cepheid single result test* Anterior Nasal Swab     Status: None   Collection Time:  10/20/22  1:03 AM   Specimen: Anterior Nasal Swab  Result Value Ref Range   SARS Coronavirus 2 by RT PCR NEGATIVE NEGATIVE    Comment: (NOTE) SARS-CoV-2 target nucleic acids are NOT DETECTED.  The SARS-CoV-2 RNA is generally detectable in upper  and lower respiratory specimens during the acute phase of infection. The lowest concentration of SARS-CoV-2 viral copies this assay can detect is 250 copies / mL. A negative result does not preclude SARS-CoV-2 infection and should not be used as the sole basis for treatment or other patient management decisions.  A negative result may occur with improper specimen collection / handling, submission of specimen other than nasopharyngeal swab, presence of viral mutation(s) within the areas targeted by this assay, and inadequate number of viral copies (<250 copies / mL). A negative result must be combined with clinical observations, patient history, and epidemiological information.  Fact Sheet for Patients:   RoadLapTop.co.za  Fact Sheet for Healthcare Providers: http://kim-miller.com/  This test is not yet approved or  cleared by the [place] FDA and has been authorized for detection and/or diagnosis of SARS-CoV-2 by FDA under an Emergency Use Authorization (EUA).  This EUA will remain in effect (meaning this test can be used) for the duration of the COVID-19 declaration under Section 564(b)(1) of the Act, 21 U.S.C. section 360bbb-3(b)(1), unless the authorization is terminated or revoked sooner.  Performed at [facility], [address] [place], [place] [address]   Urine Culture     Status: Abnormal   Collection Time: 10/20/22  8:43 AM   Specimen: Urine, Clean Catch  Result Value Ref Range   Specimen Description      URINE, CLEAN CATCH Performed at [facility], [address], [place], [place] [address]    Special Requests       Immunocompromised Performed at [facility], [address], [place], [place] [address]    Culture >=100,000 COLONIES/mL ESCHERICHIA COLI (A)    Report Status 10/22/2022 FINAL    Organism ID, Bacteria ESCHERICHIA COLI (A)       Susceptibility   Escherichia coli - MIC*    AMPICILLIN <=2 SENSITIVE Sensitive     CEFAZOLIN <=4 SENSITIVE Sensitive     CEFEPIME <=0.12 SENSITIVE Sensitive     CEFTRIAXONE <=0.25 SENSITIVE Sensitive     CIPROFLOXACIN <=0.25 SENSITIVE Sensitive     GENTAMICIN <=1 SENSITIVE Sensitive     IMIPENEM <=0.25 SENSITIVE Sensitive     NITROFURANTOIN <=16 SENSITIVE Sensitive     TRIMETH/SULFA <=20 SENSITIVE Sensitive     AMPICILLIN/SULBACTAM <=2 SENSITIVE Sensitive     PIP/TAZO <=4 SENSITIVE Sensitive     * >=100,000 COLONIES/mL ESCHERICHIA COLI  POCT glycosylated hemoglobin (Hb A1C)     Status: Abnormal   Collection Time: 11/08/22  2:53 PM  Result Value Ref Range   Hemoglobin A1C 13.5 (A) 4.0 - 5.6 %   HbA1c POC (<> result, manual entry)     HbA1c, POC (prediabetic range)     HbA1c, POC (controlled diabetic range)    HIV antibody (with reflex)     Status: None   Collection Time: 11/08/22  3:19 PM  Result Value Ref Range   HIV 1&2 Ab, 4th Generation NON-REACTIVE NON-REACTIVE    Comment: HIV-1 antigen and HIV-1/HIV-2 antibodies were not detected. There is no laboratory evidence of HIV infection. Marland Kitchen PLEASE NOTE: This information has been disclosed to you from records whose confidentiality may be protected by state law.  If your state requires such protection, then the state law prohibits you from making any further disclosure of the information without the specific written consent of the person to whom it pertains, or as otherwise permitted by law. A general authorization for the release of medical or  other information is NOT sufficient for this purpose. . For additional information please refer  to http://education.questdiagnostics.com/faq/FAQ106 (This link is being provided for informational/ educational purposes only.) . Marland Kitchen The performance of this assay has not been clinically validated in patients less than 1 years old. .   Hepatitis C Antibody     Status: Abnormal   Collection Time: 11/08/22  3:19 PM  Result Value Ref Range   Hepatitis C Ab BORDERLINE (A) NON-REACTIVE    Comment: Verified by repeat analysis. . . Based on this result, the sample will be tested for HCV RNA by a Nucleic Acid Amplification Test (NAAT) to determine if the patient has a current active infection. Marland Kitchen   HCV RNA, Quantitative Real Time PCR     Status: None   Collection Time: 11/08/22  3:19 PM  Result Value Ref Range   HCV RNA, PCR, QN <15 NOT DETECTED NOT DETECTED IU/mL   HCV Quantitative Log <1.18 NOT DETECTED NOT DETECTED Log IU/mL    Comment: . HCV RNA is not detected.  There is no laboratory  evidence of a current active HCV infection.  . This pattern of results (undetectable HCV RNA combined with reactive HCV antibody) could be consistent with a resolved past infection if the clinical history is  compatible with previous HCV exposure.  However, if no previous exposure is suspected, the reactive HCV  antibody could be a biological false positive result. . For more information on this test, go to: http://education.questdiagnostics.com/faq/FAQ22v1 (This link is being provided for informational/ educational purposes only.) . This assay is intended for use as an aid in the diagnosis of HCV infection and the management of HCV infected patients undergoing anti-viral therapy. .   Microalbumin / creatinine urine ratio     Status: None   Collection Time: 11/08/22  3:20 PM  Result Value Ref Range   Microalb, Ur 1.7 0.0 - 1.9 mg/dL   Creatinine,U 72.5 mg/dL   Microalb Creat Ratio 3.9 0.0 - 30.0 mg/g  Urinalysis w microscopic + reflex cultur     Status: Abnormal   Collection Time:  11/08/22  3:20 PM   Specimen: Urine  Result Value Ref Range   Color, Urine YELLOW YELLOW   APPearance TURBID (A) CLEAR   Specific Gravity, Urine 1.038 (H) 1.001 - 1.035   pH 6.0 5.0 - 8.0   Glucose, UA 3+ (A) NEGATIVE   Bilirubin Urine NEGATIVE NEGATIVE   Ketones, ur NEGATIVE NEGATIVE   Hgb urine dipstick TRACE (A) NEGATIVE   Protein, ur NEGATIVE NEGATIVE   Nitrites, Initial NEGATIVE NEGATIVE   Leukocyte Esterase 2+ (A) NEGATIVE   WBC, UA > OR = 60 (A) 0 - 5 /HPF   RBC / HPF 0-2 0 - 2 /HPF   Squamous Epithelial / HPF NONE SEEN < OR = 5 /HPF   Bacteria, UA MANY (A) NONE SEEN /HPF   Hyaline Cast NONE SEEN NONE SEEN /LPF   Note      Comment: This urine was analyzed for the presence of WBC,  RBC, bacteria, casts, and other formed elements.  Only those elements seen were reported. . .   Urine Culture     Status: Abnormal   Collection Time: 11/08/22  3:20 PM  Result Value Ref Range   MICRO NUMBER: [id number]    SPECIMEN QUALITY: Adequate    Sample Source URINE    STATUS: FINAL    ISOLATE 1: Escherichia coli (A)     Comment: Greater than 100,000 CFU/mL  of Escherichia coli      Susceptibility   Escherichia coli - URINE CULTURE, REFLEX    AMOX/CLAVULANIC <=2 Sensitive     AMPICILLIN <=2 Sensitive     AMPICILLIN/SULBACTAM <=2 Sensitive     CEFAZOLIN* <=4 Not Reportable      * For infections other than uncomplicated UTI caused by E. coli, K. pneumoniae or P. mirabilis: Cefazolin is resistant if MIC > or = 8 mcg/mL. (Distinguishing susceptible versus intermediate for isolates with MIC < or = 4 mcg/mL requires additional testing.) For uncomplicated UTI caused by E. coli, K. pneumoniae or P. mirabilis: Cefazolin is susceptible if MIC <32 mcg/mL and predicts susceptible to the oral agents cefaclor, cefdinir, cefpodoxime, cefprozil, cefuroxime, cephalexin and loracarbef.     CEFTAZIDIME <=1 Sensitive     CEFEPIME <=1 Sensitive     CEFTRIAXONE <=1 Sensitive     CIPROFLOXACIN  <=0.25 Sensitive     LEVOFLOXACIN <=0.12 Sensitive     GENTAMICIN <=1 Sensitive     IMIPENEM <=0.25 Sensitive     NITROFURANTOIN <=16 Sensitive     PIP/TAZO <=4 Sensitive     TOBRAMYCIN <=1 Sensitive     TRIMETH/SULFA* <=20 Sensitive      * For infections other than uncomplicated UTI caused by E. coli, K. pneumoniae or P. mirabilis: Cefazolin is resistant if MIC > or = 8 mcg/mL. (Distinguishing susceptible versus intermediate for isolates with MIC < or = 4 mcg/mL requires additional testing.) For uncomplicated UTI caused by E. coli, K. pneumoniae or P. mirabilis: Cefazolin is susceptible if MIC <32 mcg/mL and predicts susceptible to the oral agents cefaclor, cefdinir, cefpodoxime, cefprozil, cefuroxime, cephalexin and loracarbef. Legend: S = Susceptible  I = Intermediate R = Resistant  NS = Not susceptible SDD = Susceptible Dose Dependent * = Not Tested  NR = Not Reported **NN = See Therapy Comments   REFLEXIVE URINE CULTURE     Status: None   Collection Time: 11/08/22  3:20 PM  Result Value Ref Range   REFLEXIVE URINE CULTURE      Comment: CULTURE INDICATED - RESULTS TO FOLLOW  Hepatitis B surface antigen     Status: None   Collection Time: 11/17/22  8:57 AM  Result Value Ref Range   Hepatitis B Surface Ag NON-REACTIVE NON-REACTIVE    Comment: . For additional information, please refer to  http://education.[facility].com/faq/FAQ202  (This link is being provided for informational/ educational purposes only.) .   Hepatitis B surface antibody,qualitative     Status: None   Collection Time: 11/17/22  8:57 AM  Result Value Ref Range   Hep B S Ab NON-REACTIVE NON-REACTIVE  Hepatitis B core antibody, total     Status: Abnormal   Collection Time: 11/17/22  8:57 AM  Result Value Ref Range   Hep B Core Total Ab REACTIVE (A) NON-REACTIVE    Comment: . For additional information, please refer to  http://education.[facility].com/faq/FAQ202  (This link is being  provided for informational/ educational purposes only.) .   Comprehensive metabolic panel     Status: Abnormal   Collection Time: 11/17/22  8:57 AM  Result Value Ref Range   Sodium 138 135 - 145 mEq/L   Potassium 4.5 3.5 - 5.1 mEq/L   Chloride 103 96 - 112 mEq/L   CO2 24 19 - 32 mEq/L   Glucose, Bld 178 (H) 70 - 99 mg/dL   BUN 20 6 - 23 mg/dL   Creatinine, Ser 1.61 0.40 - 1.20 mg/dL   Total Bilirubin 0.8  0.2 - 1.2 mg/dL   Alkaline Phosphatase 74 39 - 117 U/L   AST 56 (H) 0 - 37 U/L   ALT 80 (H) 0 - 35 U/L   Total Protein 7.5 6.0 - 8.3 g/dL   Albumin 3.8 3.5 - 5.2 g/dL   GFR 09.32 >35.57 mL/min    Comment: Calculated using the CKD-EPI Creatinine Equation (2021)   Calcium 9.5 8.4 - 10.5 mg/dL  CBC with Differential/Platelet     Status: Abnormal   Collection Time: 11/17/22  8:57 AM  Result Value Ref Range   WBC 13.3 (H) 4.0 - 10.5 K/uL   RBC 5.10 3.87 - 5.11 Mil/uL   Hemoglobin 15.9 (H) 12.0 - 15.0 g/dL   HCT 32.2 (H) 02.5 - 42.7 %   MCV 93.6 78.0 - 100.0 fl   MCHC 33.2 30.0 - 36.0 g/dL   RDW 06.2 37.6 - 28.3 %   Platelets 349.0 150.0 - 400.0 K/uL   Neutrophils Relative % 50.7 43.0 - 77.0 %   Lymphocytes Relative 37.5 12.0 - 46.0 %   Monocytes Relative 8.0 3.0 - 12.0 %   Eosinophils Relative 3.1 0.0 - 5.0 %   Basophils Relative 0.7 0.0 - 3.0 %   Neutro Abs 6.7 1.4 - 7.7 K/uL   Lymphs Abs 5.0 (H) 0.7 - 4.0 K/uL   Monocytes Absolute 1.1 (H) 0.1 - 1.0 K/uL   Eosinophils Absolute 0.4 0.0 - 0.7 K/uL   Basophils Absolute 0.1 0.0 - 0.1 K/uL  Lipid panel     Status: None   Collection Time: 11/17/22  8:57 AM  Result Value Ref Range   Cholesterol 165 0 - 200 mg/dL    Comment: ATP III Classification       Desirable:  < 200 mg/dL               Borderline High:  200 - 239 mg/dL          High:  > = 151 mg/dL   Triglycerides 761.6 0.0 - 149.0 mg/dL    Comment: Normal:  <073 mg/dLBorderline High:  150 - 199 mg/dL   HDL 71.06 >26.94 mg/dL   VLDL 85.4 0.0 - 62.7 mg/dL   LDL  Cholesterol 87 0 - 99 mg/dL   Total CHOL/HDL Ratio 3     Comment:                Men          Women1/2 Average Risk     3.4          3.3Average Risk          5.0          4.42X Average Risk          9.6          7.13X Average Risk          15.0          11.0                       NonHDL 112.43     Comment: NOTE:  Non-HDL goal should be 30 mg/dL higher than patient's LDL goal (i.e. LDL goal of < 70 mg/dL, would have non-HDL goal of < 100 mg/dL)  TSH     Status: None   Collection Time: 11/17/22  8:57 AM  Result Value Ref Range   TSH 0.66 0.35 - 5.50 uIU/mL  Urinalysis w microscopic + reflex cultur  Status: Abnormal   Collection Time: 12/04/22 10:22 AM   Specimen: Urine  Result Value Ref Range   Color, Urine YELLOW YELLOW   APPearance CLOUDY (A) CLEAR   Specific Gravity, Urine 1.024 1.001 - 1.035   pH < OR = 5.0 5.0 - 8.0   Glucose, UA NEGATIVE NEGATIVE   Bilirubin Urine NEGATIVE NEGATIVE   Ketones, ur NEGATIVE NEGATIVE   Hgb urine dipstick NEGATIVE NEGATIVE   Protein, ur NEGATIVE NEGATIVE   Nitrites, Initial NEGATIVE NEGATIVE   Leukocyte Esterase 2+ (A) NEGATIVE   WBC, UA > OR = 60 (A) 0 - 5 /HPF   RBC / HPF 0-2 0 - 2 /HPF   Squamous Epithelial / HPF 10-20 (A) < OR = 5 /HPF   Bacteria, UA MANY (A) NONE SEEN /HPF   Hyaline Cast NONE SEEN NONE SEEN /LPF   Note      Comment: This urine was analyzed for the presence of WBC,  RBC, bacteria, casts, and other formed elements.  Only those elements seen were reported. . .   Uric acid     Status: None   Collection Time: 12/04/22 10:22 AM  Result Value Ref Range   Uric Acid, Serum 5.2 2.4 - 7.0 mg/dL  Sedimentation rate     Status: Abnormal   Collection Time: 12/04/22 10:22 AM  Result Value Ref Range   Sed Rate 59 (H) 0 - 30 mm/hr  CRP High sensitivity     Status: Abnormal   Collection Time: 12/04/22 10:22 AM  Result Value Ref Range   CRP, High Sensitivity 5.450 (H) 0.000 - 5.000 mg/L    Comment: Note:  An elevated hs-CRP (>5  mg/L) should be repeated after 2 weeks to rule out recent infection or trauma.  Hep B Core Ab W/Reflex     Status: None   Collection Time: 12/04/22 10:22 AM  Result Value Ref Range   Hep B Core Total Ab Negative Negative  Urine Culture     Status: Abnormal   Collection Time: 12/04/22 10:22 AM  Result Value Ref Range   MICRO NUMBER: [id number]    SPECIMEN QUALITY: Adequate    Sample Source URINE    STATUS: FINAL    ISOLATE 1: Escherichia coli (A)     Comment: Greater than 100,000 CFU/mL of Escherichia coli      Susceptibility   Escherichia coli - URINE CULTURE, REFLEX    AMOX/CLAVULANIC <=2 Sensitive     AMPICILLIN <=2 Sensitive     AMPICILLIN/SULBACTAM <=2 Sensitive     CEFAZOLIN* <=4 Not Reportable      * For infections other than uncomplicated UTI caused by E. coli, K. pneumoniae or P. mirabilis: Cefazolin is resistant if MIC > or = 8 mcg/mL. (Distinguishing susceptible versus intermediate for isolates with MIC < or = 4 mcg/mL requires additional testing.) For uncomplicated UTI caused by E. coli, K. pneumoniae or P. mirabilis: Cefazolin is susceptible if MIC <32 mcg/mL and predicts susceptible to the oral agents cefaclor, cefdinir, cefpodoxime, cefprozil, cefuroxime, cephalexin and loracarbef.     CEFTAZIDIME <=1 Sensitive     CEFEPIME <=1 Sensitive     CEFTRIAXONE <=1 Sensitive     CIPROFLOXACIN <=0.25 Sensitive     LEVOFLOXACIN <=0.12 Sensitive     GENTAMICIN <=1 Sensitive     IMIPENEM <=0.25 Sensitive     NITROFURANTOIN <=16 Sensitive     PIP/TAZO <=4 Sensitive     TOBRAMYCIN <=1 Sensitive     TRIMETH/SULFA* <=20 Sensitive      *  For infections other than uncomplicated UTI caused by E. coli, K. pneumoniae or P. mirabilis: Cefazolin is resistant if MIC > or = 8 mcg/mL. (Distinguishing susceptible versus intermediate for isolates with MIC < or = 4 mcg/mL requires additional testing.) For uncomplicated UTI caused by E. coli, K. pneumoniae or P. mirabilis: Cefazolin  is susceptible if MIC <32 mcg/mL and predicts susceptible to the oral agents cefaclor, cefdinir, cefpodoxime, cefprozil, cefuroxime, cephalexin and loracarbef. Legend: S = Susceptible  I = Intermediate R = Resistant  NS = Not susceptible SDD = Susceptible Dose Dependent * = Not Tested  NR = Not Reported **NN = See Therapy Comments   REFLEXIVE URINE CULTURE     Status: None   Collection Time: 12/04/22 10:22 AM  Result Value Ref Range   REFLEXIVE URINE CULTURE      Comment: CULTURE INDICATED - RESULTS TO FOLLOW  Urinalysis, Routine w reflex microscopic     Status: Abnormal   Collection Time: 12/05/22 10:40 AM  Result Value Ref Range   Specific Gravity, UA 1.020 1.005 - 1.030   pH, UA 7.0 5.0 - 7.5   Color, UA Yellow Yellow   Appearance Ur Cloudy (A) Clear   Leukocytes,UA 2+ (A) Negative   Protein,UA Negative Negative/Trace   Glucose, UA Negative Negative   Ketones, UA Negative Negative   RBC, UA Trace (A) Negative   Bilirubin, UA Negative Negative   Urobilinogen, Ur 1.0 0.2 - 1.0 mg/dL   Nitrite, UA Negative Negative   Microscopic Examination See below:   Microscopic Examination     Status: Abnormal   Collection Time: 12/05/22 10:40 AM   Urine  Result Value Ref Range   WBC, UA >30 (A) 0 - 5 /hpf   RBC, Urine 0-2 0 - 2 /hpf   Epithelial Cells (non renal) 0-10 0 - 10 /hpf   Mucus, UA Present Not Estab.   Bacteria, UA Few (A) None seen/Few        Garner Nash, MD, MS

## 2022-12-15 ENCOUNTER — Telehealth: Payer: Self-pay

## 2022-12-15 NOTE — Assessment & Plan Note (Signed)
Under management with prophylactic nitrofurantoin, cranberry supplements, and other supportive therapies. Plan: Continue current management. Monitor for urinary symptoms to ensure no underlying UTI is exacerbating gastrointestinal symptoms.

## 2022-12-15 NOTE — Assessment & Plan Note (Addendum)
Not well managed with the current medication due to side effects of tirzepatide 5 mg.   Plan: Continue metformin 1000 mg twice a day. Discontinue tirzepatide (Mounjaro) 5 mg. Prescribe pioglitazone 30 mg once a day for additional glycemic control. Consider resuming tirzepatide 2.5 mg when financially feasible. Trial metoclopramide 5 mg 3 times daily as needed nausea Encourage adequate hydration Follow-Up Recommendations: Reassess in one month or sooner if symptoms persist or worsen.

## 2022-12-15 NOTE — Progress Notes (Signed)
   Care Guide Note  12/15/2022 Name: [name] MRN: [id number] DOB: [birth year]  Referred by: Garnette Gunner, MD Reason for referral : Care Coordination (Outreach to schedule with Pharm d )   [name] is a 52 y.o. year old female who is a primary care patient of Garnette Gunner, MD. Deon Pilling was referred to the pharmacist for assistance related to DM.    An unsuccessful telephone outreach was attempted today to contact the patient who was referred to the pharmacy team for assistance with medication management. Additional attempts will be made to contact the patient.   Penne Lash, RMA Care Guide [facility]  [place], [place] [address] Direct Dial: [phone] [email or web address]

## 2022-12-22 NOTE — Progress Notes (Signed)
   Care Guide Note  12/22/2022 Name: [name] MRN: [id number] DOB: 30-Jul-1970  Referred by: Garnette Gunner, MD Reason for referral : Care Coordination (Outreach to schedule with Pharm d )   [name] is a 51 y.o. year old female who is a primary care patient of Garnette Gunner, MD. Deon Pilling was referred to the pharmacist for assistance related to DM.    Successful contact was made with the patient to discuss pharmacy services including being ready for the pharmacist to call at least 5 minutes before the scheduled appointment time, to have medication bottles and any blood sugar or blood pressure readings ready for review. The patient agreed to meet with the pharmacist via with the pharmacist via telephone visit on (date/time).  12/29/2022  Penne Lash, RMA Care Guide [facility]  [place], [place] [address] Direct Dial: [phone] [email or web address]

## 2022-12-29 ENCOUNTER — Other Ambulatory Visit: Payer: BC Managed Care – PPO

## 2022-12-29 DIAGNOSIS — E1142 Type 2 diabetes mellitus with diabetic polyneuropathy: Secondary | ICD-10-CM

## 2022-12-29 NOTE — Progress Notes (Unsigned)
12/29/2022 Name: [name] MRN: [id number] DOB: 12/19/1970  Chief Complaint  Patient presents with   Diabetes Management Plan   [name] is a 52 y.o. year old female who presented for a telephone visit.   They were referred to the pharmacist by their PCP for assistance in managing diabetes.   Subjective:  Care Team: Primary Care Provider: Garnette Gunner, MD ; Next Scheduled Visit: 11/26  Medication Access/Adherence  Current Pharmacy:  [facility] [address] [place] - [address] [place] [address] Phone: [phone] Fax: [phone]  -Patient reports affordability concerns with their medications: Yes  -Patient reports access/transportation concerns to their pharmacy: No  -Patient reports adherence concerns with their medications:  Yes    Diabetes: Current medications: Metformin 1000mg  BID -Prescribed Mounjaro but has not used in 3 weeks because of cost.  Copay approximately $150 for 1 month supply.   -Patient took [facility] 2.5mg  weekly x4 weeks then increase to 5mg  weekly, which she could not tolerate due to severe nausea not relieved with ondansetron. -Patient does not check home BG daily, so unable to provide recent readings -Last A1c was 13.5% one month ago  Objective: Lab Results  Component Value Date   HGBA1C 13.5 (A) 11/08/2022   Lab Results  Component Value Date   CREATININE 0.77 11/17/2022   BUN 20 11/17/2022   NA 138 11/17/2022   K 4.5 11/17/2022   CL 103 11/17/2022   CO2 24 11/17/2022   Medications Reviewed Today     Reviewed by Lenna Gilford, RPH (Pharmacist) on 12/29/22 at 0850  Med List Status: <None>   Medication Order Taking? Sig Documenting Provider Last Dose Status Informant  Aspirin-Caffeine (BC FAST PAIN RELIEF ARTHRITIS) 1000-65 MG PACK [id number]  Take by mouth.  Patient not taking: Reported on 12/04/2022   [provider]  Active   Blood Glucose Monitoring Suppl DEVI  [id number] Yes 1 each by Does not apply route in the morning, at noon, and at bedtime. May substitute to any manufacturer covered by patient's insurance. Garnette Gunner, MD Taking Active   ibuprofen (ADVIL,MOTRIN) 200 MG tablet [id number] Yes Take 800 mg by mouth every 6 (six) hours as needed for pain. [provider] Taking Active Self  metFORMIN (GLUCOPHAGE) 500 MG tablet [id number] Yes Take 2 tablets (1,000 mg total) by mouth 2 (two) times daily with a meal. Garnette Gunner, MD Taking Active   metoCLOPramide (REGLAN) 5 MG tablet [id number] Yes Take 1 tablet (5 mg total) by mouth every 8 (eight) hours as needed for nausea or vomiting. Garnette Gunner, MD Taking Active   nitrofurantoin, macrocrystal-monohydrate, (MACROBID) 100 MG capsule [id number] Yes Take 1 capsule (100 mg total) by mouth daily. Stoneking, Danford Bad., MD Taking Active   pioglitazone (ACTOS) 30 MG tablet [id number] Yes Take 1 tablet (30 mg total) by mouth daily. Garnette Gunner, MD Taking Active   tirzepatide [facility]) 2.5 MG/0.5ML Pen [id number] No Inject 2.5 mg into the skin once a week.  Patient not taking: Reported on 12/29/2022   Garnette Gunner, MD Not Taking Active            Assessment/Plan:   Diabetes: - Currently uncontrolled - Obtained copay card for [facility] and gave to pharmacy, but this did not affect her copay; because an evoucher was already automatically applied - I recommend seeing if Trulicity would be more affordable; and I was able to get a PA approved by her insurance  and get a copay card.  Copay card for Trulicity will usually pay more each month than those for other medications in this drug class.   -Sending MyChart message to patient to see if she is in agreement  Follow Up Plan: If patient agreeable to plan, will coordinate with Dr. Janee Morn get prescription sent and will check copay with pharmacy  Lenna Gilford, PharmD, DPLA

## 2023-01-02 MED ORDER — TRULICITY 0.75 MG/0.5ML ~~LOC~~ SOAJ: 0.7500 mg | SUBCUTANEOUS | 0 refills | Status: DC

## 2023-01-02 NOTE — Progress Notes (Signed)
   01/02/2023  Patient ID: [name], female   DOB: 02-Jul-1970, 52 y.o.   MRN: [id number]  Trulicity 0.75mg  order signed and sent to [facility] by Dr. Janee Morn.  Contacted pharmacy to check on copay, and the prescription is going through for $99 on insurance and copay card.  Sending MyChart message to patient to inform.  If not affordable, we will need to consider different medication classes.  Lenna Gilford, PharmD, DPLA

## 2023-01-02 NOTE — Addendum Note (Signed)
Addended by: Sabino Niemann A on: 01/02/2023 08:19 AM   Modules accepted: Orders

## 2023-01-02 NOTE — Progress Notes (Signed)
   01/02/2023  Patient ID: [name], female   DOB: February 01, 1971, 52 y.o.   MRN: [id number]  Patient is open to Trulicity 0.75mg  weekly if more affordable than Mounjaro.  Pending prescription for Dr. Janee Morn to sign if in agreement.  I have included copay card information that should further help with cost.  Once order is signed, I will contact the pharmacy to verify copay and let patient know.  Lenna Gilford, PharmD, DPLA

## 2023-01-08 ENCOUNTER — Ambulatory Visit: Payer: BC Managed Care – PPO | Admitting: Urology

## 2023-01-08 NOTE — Progress Notes (Deleted)
Assessment: 1. Frequent UTI     Plan: Recommend beginning treatment with Cipro 500 mg twice daily x 5 days.  Prescription sent. Will likely need a daily prophylactic antibiotic given her frequent UTIs. Methods to reduce the risk of UTIs discussed including fluid intake, timed and double voiding, daily cranberry supplement, and daily probiotic. Recommend management of her constipation to improve bladder emptying and reduce the risk of UTIs. Return to office in 4 weeks.  Chief Complaint:  No chief complaint on file.   History of Present Illness:  [name] is a 52 y.o. female who is seen for further evaluation of UTI's. She reported a history of UTI symptoms for approximately 1 year.  She did not seek medical attention as she did not have insurance at that time. She presented to the emergency room on 10/20/2022 with abdominal pain, frequency and dysuria.  She was also having some low back pain.  No fevers or chills.  White cell count was elevated to 14.7. CT imaging showed no renal or ureteral calculi, no evidence of obstruction, subtle regions of cortical hypoenhancement in the right kidney consistent with pyelonephritis, small amount of air within the bladder. Urine culture results: 10/20/2022 >100 K E. coli, pansensitive - treated with Rocephin and Duracef 11/09/2022 >100 K E. Coli - treated with Keflex 12/04/22 >100K E. Coli - treated with Cipro  She notes improvement in her symptoms while on the antibiotics.  Her symptoms return shortly after completion of the antibiotic.  She was having some intermittent frequency and dysuria.  No recent gross hematuria.  She reported noting some mucus in the urine.  She also reported passing "sand" in her urine recently.  She does report a prior history of kidney stones. She was started on daily Macrobid in 10/24.  Portions of the above documentation were copied from a prior visit for review purposes only.   Past Medical History:  Past  Medical History:  Diagnosis Date   ALLERGIC RHINITIS    Allergy    Anemia    Anxiety    CHF (congestive heart failure) (HCC)    DEPRESSION    DIABETES MELLITUS, TYPE II    HEPATITIS C    Hx MRSA infection    HYPERTENSION    HYPONATREMIA, CHRONIC    Irritable bowel syndrome    LEUKOCYTOSIS UNSPECIFIED    chronic s/p heme eval (odogwu 2010)   MIGRAINE, UNSPEC., W/O INTRACTABLE MIGRAINE    OSA (obstructive sleep apnea)    CPAP qhs   Puncture wound of right foot    penetrated 3/4 of an inch   RENAL CALCULUS, HX OF    Sleep apnea    Substance abuse (HCC)    TOBACCO DEPENDENCE    Ulcer     Past Surgical History:  Past Surgical History:  Procedure Laterality Date   ABDOMINAL HYSTERECTOMY  02/20/2005   LIVER BIOPSY  02/20/1997   TUBAL LIGATION  02/20/1993    Allergies:  Allergies  Allergen Reactions   Dilaudid [Hydromorphone]    Erythromycin Nausea And Vomiting    Stomach upset   Ginseng Nausea And Vomiting and Swelling   Mushroom Extract Complex Nausea And Vomiting   Pineapple Hives    unknown   Sulfa Antibiotics Hives and Itching    itching   Other Hives and Rash    [place]    Family History:  Family History  Problem Relation Age of Onset   Diabetes Mother    Miscarriages / [place] Mother  Obesity Mother    Mental illness Father    Alcohol abuse Father    Anxiety disorder Sister    Depression Sister    Obesity Sister    Breast cancer Maternal Grandmother    Cancer Maternal Grandmother    Alcohol abuse Other        parents   Colon cancer Other        grandparent   Lung cancer Other        grandparent   Ovarian cancer Other        grandparent   Heart disease Other        parent   Alcohol abuse Maternal Grandfather    Hearing loss Paternal Grandfather    Anxiety disorder Sister    Obesity Sister     Social History:  Social History   Tobacco Use   Smoking status: Former    Current packs/day: 0.00    Average packs/day: 1 pack/day for  25.0 years (25.0 ttl pk-yrs)    Types: Cigarettes    Quit date: 04/06/2017    Years since quitting: 5.7   Smokeless tobacco: Never  Vaping Use   Vaping status: Never Used  Substance Use Topics   Alcohol use: No   Drug use: No    ROS: Constitutional:  Negative for fever, chills, weight loss CV: Negative for chest pain, previous MI, hypertension Respiratory:  Negative for shortness of breath, wheezing, sleep apnea, frequent cough GI:  Negative for nausea, vomiting, bloody stool, GERD  Physical exam: There were no vitals taken for this visit. GENERAL APPEARANCE:  Well appearing, well developed, well nourished, NAD HEENT:  Atraumatic, normocephalic, oropharynx clear NECK:  Supple without lymphadenopathy or thyromegaly ABDOMEN:  Soft, non-tender, no masses EXTREMITIES:  Moves all extremities well, without clubbing, cyanosis, or edema NEUROLOGIC:  Alert and oriented x 3, normal gait, CN II-XII grossly intact MENTAL STATUS:  appropriate BACK:  Non-tender to palpation, No CVAT SKIN:  Warm, dry, and intact  Results: U/A:

## 2023-01-16 ENCOUNTER — Encounter: Payer: Self-pay | Admitting: Family Medicine

## 2023-01-16 ENCOUNTER — Ambulatory Visit: Payer: BC Managed Care – PPO | Admitting: Family Medicine

## 2023-01-16 VITALS — BP 128/86 | HR 78 | Temp 97.6°F | Wt 231.4 lb

## 2023-01-16 DIAGNOSIS — K529 Noninfective gastroenteritis and colitis, unspecified: Secondary | ICD-10-CM | POA: Diagnosis not present

## 2023-01-16 DIAGNOSIS — N390 Urinary tract infection, site not specified: Secondary | ICD-10-CM | POA: Diagnosis not present

## 2023-01-16 DIAGNOSIS — R0789 Other chest pain: Secondary | ICD-10-CM

## 2023-01-16 DIAGNOSIS — E1142 Type 2 diabetes mellitus with diabetic polyneuropathy: Secondary | ICD-10-CM

## 2023-01-16 DIAGNOSIS — Z599 Problem related to housing and economic circumstances, unspecified: Secondary | ICD-10-CM | POA: Insufficient documentation

## 2023-01-16 DIAGNOSIS — Z1231 Encounter for screening mammogram for malignant neoplasm of breast: Secondary | ICD-10-CM

## 2023-01-16 MED ORDER — METFORMIN HCL 500 MG PO TABS: 1000.0000 mg | ORAL_TABLET | Freq: Two times a day (BID) | ORAL | 3 refills | Status: DC

## 2023-01-16 MED ORDER — PIOGLITAZONE HCL 45 MG PO TABS: 45.0000 mg | ORAL_TABLET | Freq: Every day | ORAL | 3 refills | Status: DC

## 2023-01-16 NOTE — Assessment & Plan Note (Signed)
Occasional chest pain under the breast likely musculoskeletal in nature; described as muscle spasms associated with fatigue or exertion. Previous EKG in August was stable.  Plan: Monitor symptoms. Consider further cardiac evaluation if pain increases in frequency or changes in character. Reassess at the next visit.

## 2023-01-16 NOTE — Assessment & Plan Note (Signed)
Blood sugars improved to 170 mg/dL on current regimen. Patient tolerating pioglitazone without adverse effects. Hesitant to start GLP-1 agonists due to prior side effects.  Plan: Continue metformin 1000 mg twice a day. Increase pioglitazone to 45 mg once a day for additional glycemic control. Hold off on initiating dulaglutide (Trulicity) at this time. Reassess in two months to monitor blood sugar levels and medication efficacy.

## 2023-01-16 NOTE — Progress Notes (Signed)
Assessment/Plan:   Problem List Items Addressed This Visit       Digestive   Chronic diarrhea    Ongoing diarrhea possibly due to antibiotic-associated colitis or Clostridioides difficile infection following multiple antibiotic courses.  Plan: Order stool PCR test for Clostridioides difficile. Encourage maintaining hydration. Follow up on stool test results; patient has a gastroenterology appointment scheduled for January 6th.      Relevant Orders   GI Profile, Stool, PCR     Endocrine   Type 2 diabetes mellitus with peripheral neuropathy (HCC) - Primary    Blood sugars improved to 170 mg/dL on current regimen. Patient tolerating pioglitazone without adverse effects. Hesitant to start GLP-1 agonists due to prior side effects.  Plan: Continue metformin 1000 mg twice a day. Increase pioglitazone to 45 mg once a day for additional glycemic control. Hold off on initiating dulaglutide (Trulicity) at this time. Reassess in two months to monitor blood sugar levels and medication efficacy.      Relevant Medications   pioglitazone (ACTOS) 45 MG tablet   metFORMIN (GLUCOPHAGE) 500 MG tablet     Genitourinary   Frequent UTI    Under management with prophylactic nitrofurantoin; patient ran out of medication one week ago.  Plan: Informed still has refills on prescription Continue nitrofurantoin (Macrobid) 100 mg once daily at bedtime. Patient to follow up with urology tomorrow for ongoing management.        Other   Chest wall pain    Occasional chest pain under the breast likely musculoskeletal in nature; described as muscle spasms associated with fatigue or exertion. Previous EKG in August was stable.  Plan: Monitor symptoms. Consider further cardiac evaluation if pain increases in frequency or changes in character. Reassess at the next visit.      Financial difficulties    Patient experiencing stress and difficulty managing mood due to financial constraints and  personal circumstances. Declines formal counseling but utilizes support from a licensed counselor friend.  Plan: Monitor mood and stress levels Offer support and monitor emotional well-being. Reassess openness to formal counseling in future visits.       Other Visit Diagnoses     Screening mammogram for breast cancer       Relevant Orders   MM DIGITAL SCREENING BILATERAL       Medications Discontinued During This Encounter  Medication Reason   metoCLOPramide (REGLAN) 5 MG tablet    Aspirin-Caffeine (BC FAST PAIN RELIEF ARTHRITIS) 1000-65 MG PACK    metFORMIN (GLUCOPHAGE) 500 MG tablet Reorder   pioglitazone (ACTOS) 30 MG tablet    Dulaglutide (TRULICITY) 0.75 MG/0.5ML SOAJ     No follow-ups on file.    Subjective:   Encounter date: 01/16/2023  Janiylah Cooksley is a 52 y.o. female who has Type 2 diabetes mellitus with peripheral neuropathy (HCC); HYPONATREMIA, CHRONIC; LEUKOCYTOSIS UNSPECIFIED; TOBACCO DEPENDENCE; MDD (major depressive disorder), recurrent episode, moderate (HCC); OBSTRUCTIVE SLEEP APNEA; MIGRAINE, UNSPEC., W/O INTRACTABLE MIGRAINE; HYPERTENSION; ALLERGIC RHINITIS; IRRITABLE BOWEL SYNDROME; Autoimmune hepatitis (HCC); BACK PAIN; SLEEP APNEA; RENAL CALCULUS, HX OF; Pain of right hip; Pyelonephritis; Alcohol use disorder, severe, in sustained remission, in controlled environment (HCC); Frequent UTI; History of hepatitis C; Chronic diarrhea; Chest wall pain; and Financial difficulties on their problem list..   She  has a past medical history of ALLERGIC RHINITIS, Allergy, Anemia, Anxiety, CHF (congestive heart failure) (HCC), DEPRESSION, DIABETES MELLITUS, TYPE II, HEPATITIS C, MRSA infection, HYPERTENSION, HYPONATREMIA, CHRONIC, Irritable bowel syndrome, LEUKOCYTOSIS UNSPECIFIED, MIGRAINE, UNSPEC., W/O INTRACTABLE MIGRAINE, OSA (obstructive  sleep apnea), Puncture wound of right foot, RENAL CALCULUS, HX OF, Sleep apnea, Substance abuse (HCC), TOBACCO DEPENDENCE, and  Ulcer..   She presents with chief complaint of Medical Management of Chronic Issues (4 weeks (around 01/11/2023) for DM. Still experiencing diarrhea with mounjaro in the past. Tx with lopermide otc.  Haven't started trulicity) .  Patient reports persistent diarrhea for the past couple of weeks, which began after multiple antibiotic treatments for urinary tract infections.  Diarrhea persists despite discontinuation of Mounjaro (tirzepatide) two weeks ago; patient notes that nausea improved after stopping Mounjaro.  Patient is hesitant to start Trulicity (dulaglutide) due to fear of similar side effects experienced with Mounjaro.  Currently taking loperamide to manage diarrhea but continues to experience leakage, requiring the use of pads.  Home fasting blood sugars are currently around 170 mg/dL. Denies excessive thirst or urination.  Reports occasional bilateral chest pain chest pain under the breast area, which has been occurring for a couple of years. Describes it as sometimes feeling like muscle spasms, occurring with fatigue or extra exertion.  Stress due to financial constraints and personal circumstances; reports difficulty managing mood but declines formal counseling.  HGBA1C Lab Results  Component Value Date   HGBA1C 13.5 (A) 11/08/2022   HGBA1C 7.7 08/31/2015   HGBA1C 5.9 04/09/2014    Lipid Panel Lab Results  Component Value Date   CHOL 165 11/17/2022   TRIG 128.0 11/17/2022   HDL 52.10 11/17/2022   LDLCALC 87 11/17/2022    Renal Function Lab Results  Component Value Date   CREATININE 0.77 11/17/2022   GFR 87.63 11/17/2022   MICROALBUR 1.7 11/08/2022   LABCREAU 43.2 11/08/2022   MICRALBCREAT 3.9 11/08/2022    Review of Systems  Constitutional:  Positive for chills and fever.  All other systems reviewed and are negative.   Past Surgical History:  Procedure Laterality Date   ABDOMINAL HYSTERECTOMY  02/20/2005   LIVER BIOPSY  02/20/1997   TUBAL LIGATION   02/20/1993    Outpatient Medications Prior to Visit  Medication Sig Dispense Refill   Blood Glucose Monitoring Suppl DEVI 1 each by Does not apply route in the morning, at noon, and at bedtime. May substitute to any manufacturer covered by patient's insurance. 1 each 0   ibuprofen (ADVIL,MOTRIN) 200 MG tablet Take 800 mg by mouth every 6 (six) hours as needed for pain.     nitrofurantoin, macrocrystal-monohydrate, (MACROBID) 100 MG capsule Take 1 capsule (100 mg total) by mouth daily. 30 capsule 2   metFORMIN (GLUCOPHAGE) 500 MG tablet Take 2 tablets (1,000 mg total) by mouth 2 (two) times daily with a meal. 360 tablet 0   Aspirin-Caffeine (BC FAST PAIN RELIEF ARTHRITIS) 1000-65 MG PACK Take by mouth. (Patient not taking: Reported on 12/04/2022)     Dulaglutide (TRULICITY) 0.75 MG/0.5ML SOAJ Inject 0.75 mg into the skin once a week. Please add patient-specific copay card: BIN: [id number], PCN: [id number], Grp: [id number], ID: [id number] (Patient not taking: Reported on 01/16/2023) 6 mL 0   metoCLOPramide (REGLAN) 5 MG tablet Take 1 tablet (5 mg total) by mouth every 8 (eight) hours as needed for nausea or vomiting. 120 tablet 0   pioglitazone (ACTOS) 30 MG tablet Take 1 tablet (30 mg total) by mouth daily. 30 tablet 0   No facility-administered medications prior to visit.    Family History  Problem Relation Age of Onset   Diabetes Mother    Miscarriages / [place] Mother    Obesity Mother    Mental  illness Father    Alcohol abuse Father    Anxiety disorder Sister    Depression Sister    Obesity Sister    Breast cancer Maternal Grandmother    Cancer Maternal Grandmother    Alcohol abuse Other        parents   Colon cancer Other        grandparent   Lung cancer Other        grandparent   Ovarian cancer Other        grandparent   Heart disease Other        parent   Alcohol abuse Maternal Grandfather    Hearing loss Paternal Grandfather    Anxiety disorder Sister    Obesity Sister      Social History   Socioeconomic History   Marital status: Divorced    Spouse name: n/a   Number of children: 1   Years of education: Not on file   Highest education level: Bachelor's degree (e.g., BA, AB, BS)  Occupational History   Occupation: Marine scientist  Tobacco Use   Smoking status: Former    Current packs/day: 0.00    Average packs/day: 1 pack/day for 25.0 years (25.0 ttl pk-yrs)    Types: Cigarettes    Quit date: 04/06/2017    Years since quitting: 5.7   Smokeless tobacco: Never  Vaping Use   Vaping status: Never Used  Substance and Sexual Activity   Alcohol use: No   Drug use: No   Sexual activity: Yes    Birth control/protection: None    Comment: I have no uterus, and am careful about STIs  Other Topics Concern   Not on file  Social History Narrative   Lives with 9 animals, and is currently fostering 3 kittens   Social Determinants of Health   Financial Resource Strain: Low Risk  (12/01/2022)   Overall Financial Resource Strain (CARDIA)    Difficulty of Paying Living Expenses: Not very hard  Recent Concern: Financial Resource Strain - Medium Risk (11/06/2022)   Overall Financial Resource Strain (CARDIA)    Difficulty of Paying Living Expenses: Somewhat hard  Food Insecurity: No Food Insecurity (12/01/2022)   Hunger Vital Sign    Worried About Running Out of Food in the Last Year: Never true    Ran Out of Food in the Last Year: Never true  Transportation Needs: No Transportation Needs (12/01/2022)   PRAPARE - Administrator, Civil Service (Medical): No    Lack of Transportation (Non-Medical): No  Physical Activity: Inactive (12/01/2022)   Exercise Vital Sign    Days of Exercise per Week: 0 days    Minutes of Exercise per Session: 150+ min  Stress: Stress Concern Present (12/01/2022)   [employer] of Occupational Health - Occupational Stress Questionnaire    Feeling of Stress : To some extent  Social Connections: Moderately Isolated  (12/01/2022)   Social Connection and Isolation Panel [NHANES]    Frequency of Communication with Friends and Family: More than three times a week    Frequency of Social Gatherings with Friends and Family: Once a week    Attends Religious Services: 1 to 4 times per year    Active Member of [employer] or Organizations: No    Attends Engineer, structural: Not on file    Marital Status: Divorced  Intimate Partner Violence: Not on file  Objective:  Physical Exam: BP 128/86 (BP Location: Left Arm, Patient Position: Sitting, Cuff Size: Large)   Pulse 78   Temp 97.6 F (36.4 C) (Temporal)   Wt 231 lb 6.4 oz (105 kg)   SpO2 98%   BMI 39.72 kg/m     Physical Exam Constitutional:      General: She is not in acute distress.    Appearance: Normal appearance. She is not ill-appearing or toxic-appearing.  HENT:     Head: Normocephalic and atraumatic.     Nose: Nose normal. No congestion.  Eyes:     General: No scleral icterus.    Extraocular Movements: Extraocular movements intact.  Cardiovascular:     Rate and Rhythm: Normal rate and regular rhythm.     Pulses: Normal pulses.     Heart sounds: Normal heart sounds.  Pulmonary:     Effort: Pulmonary effort is normal. No respiratory distress.     Breath sounds: Normal breath sounds.  Abdominal:     General: Abdomen is flat. Bowel sounds are normal.     Palpations: Abdomen is soft.  Musculoskeletal:        General: Normal range of motion.  Lymphadenopathy:     Cervical: No cervical adenopathy.  Skin:    General: Skin is warm and dry.     Findings: No rash.  Neurological:     General: No focal deficit present.     Mental Status: She is alert and oriented to person, place, and time. Mental status is at baseline.  Psychiatric:        Mood and Affect: Mood normal.        Behavior: Behavior normal.        Thought Content: Thought  content normal.        Judgment: Judgment normal.     CT ABDOMEN PELVIS W CONTRAST  Result Date: 10/20/2022 CLINICAL DATA:  Abdominal pain, acute, nonlocalized EXAM: CT ABDOMEN AND PELVIS WITH CONTRAST TECHNIQUE: Multidetector CT imaging of the abdomen and pelvis was performed using the standard protocol following bolus administration of intravenous contrast. RADIATION DOSE REDUCTION: This exam was performed according to the departmental dose-optimization program which includes automated exposure control, adjustment of the mA and/or kV according to patient size and/or use of iterative reconstruction technique. CONTRAST:  OMNIPAQUE IOHEXOL 300 MG/ML  SOLN COMPARISON:  CT AP 06/06/12 FINDINGS: Lower chest: No acute abnormality. Hepatobiliary: Liver has a normal contour. No focal liver lesions are visualized. Portal veins are contrast opacify. No evidence of intra or extrahepatic biliary ductal dilatation. No evidence of cholelithiasis or cholecystitis. Pancreas: No evidence of peripancreatic fat stranding to suggest pancreatitis. No evidence of pancreatic ductal dilatation. Spleen: Normal in size without focal abnormality. Adrenals/Urinary Tract: Bilateral adrenal glands are normal in appearance. Bilateral kidneys are normal in size without evidence of hydronephrosis or nephrolithiasis. There are subtle regions of cortical hypoenhancement along the upper pole in the interpolar region of the right kidney (series 7, image 89), which are worrisome for pyelonephritis. The urinary bladder is fluid-filled with a small amount of air in the anti dependent portion, which can be seen in the setting of infection. Stomach/Bowel: No evidence of bowel obstruction. There is diverticulosis without evidence of diverticulitis. The appendix is normal in appearance Vascular/Lymphatic: No significant vascular findings are present. No enlarged abdominal or pelvic lymph nodes. Reproductive: Status post hysterectomy. No adnexal  masses. Other: No abdominal wall hernia or abnormality. No abdominopelvic ascites. Musculoskeletal: No acute or significant osseous findings. IMPRESSION:  1. Subtle regions of cortical hypoenhancement along the upper pole and interpolar region of the right kidney are worrisome for pyelonephritis. Recommend correlation with urinalysis. 2. Small amount of air in the anti dependent portion of the urinary bladder, which can be seen in the setting of infection. Recommend correlation with urinalysis. Electronically Signed   By: Lorenza Cambridge M.D.   On: 10/20/2022 08:04    Recent Results (from the past 2160 hour(s))  Lipase, blood     Status: None   Collection Time: 10/20/22 12:46 AM  Result Value Ref Range   Lipase 27 11 - 51 U/L    Comment: Performed at [facility], [address], [place], [place] [address]  Comprehensive metabolic panel     Status: Abnormal   Collection Time: 10/20/22 12:46 AM  Result Value Ref Range   Sodium 132 (L) 135 - 145 mmol/L   Potassium 3.8 3.5 - 5.1 mmol/L   Chloride 101 98 - 111 mmol/L   CO2 21 (L) 22 - 32 mmol/L   Glucose, Bld 314 (H) 70 - 99 mg/dL    Comment: Glucose reference range applies only to samples taken after fasting for at least 8 hours.   BUN 12 6 - 20 mg/dL   Creatinine, Ser 6.04 0.44 - 1.00 mg/dL   Calcium 8.7 (L) 8.9 - 10.3 mg/dL   Total Protein 7.6 6.5 - 8.1 g/dL   Albumin 3.2 (L) 3.5 - 5.0 g/dL   AST 18 15 - 41 U/L   ALT 32 0 - 44 U/L   Alkaline Phosphatase 66 38 - 126 U/L   Total Bilirubin 0.9 0.3 - 1.2 mg/dL   GFR, Estimated >54 >09 mL/min    Comment: (NOTE) Calculated using the CKD-EPI Creatinine Equation (2021)    Anion gap 10 5 - 15    Comment: Performed at [facility], [address], [place], [place] [address]  CBC with Differential     Status: Abnormal   Collection Time: 10/20/22 12:46 AM  Result Value Ref Range   WBC 14.7 (H) 4.0 - 10.5 K/uL   RBC 4.81 3.87 - 5.11 MIL/uL   Hemoglobin 15.3  (H) 12.0 - 15.0 g/dL   HCT 47.8 29.5 - 62.1 %   MCV 92.1 80.0 - 100.0 fL   MCH 31.8 26.0 - 34.0 pg   MCHC 34.5 30.0 - 36.0 g/dL   RDW 30.8 65.7 - 84.6 %   Platelets 338 150 - 400 K/uL    Comment: REPEATED TO VERIFY   nRBC 0.0 0.0 - 0.2 %   Neutrophils Relative % 67 %   Neutro Abs 9.9 (H) 1.7 - 7.7 K/uL   Lymphocytes Relative 18 %   Lymphs Abs 2.7 0.7 - 4.0 K/uL   Monocytes Relative 13 %   Monocytes Absolute 2.0 (H) 0.1 - 1.0 K/uL   Eosinophils Relative 0 %   Eosinophils Absolute 0.0 0.0 - 0.5 K/uL   Basophils Relative 1 %   Basophils Absolute 0.1 0.0 - 0.1 K/uL   Immature Granulocytes 1 %   Abs Immature Granulocytes 0.08 (H) 0.00 - 0.07 K/uL    Comment: Performed at [facility], [address], [place], [place] [address]  Urinalysis, Routine w reflex microscopic -Urine, Clean Catch     Status: Abnormal   Collection Time: 10/20/22 12:56 AM  Result Value Ref Range   Color, Urine YELLOW YELLOW   APPearance CLOUDY (A) CLEAR   Specific Gravity, Urine 1.012 1.005 -  1.030   pH 6.0 5.0 - 8.0   Glucose, UA >=500 (A) NEGATIVE mg/dL   Hgb urine dipstick NEGATIVE NEGATIVE   Bilirubin Urine NEGATIVE NEGATIVE   Ketones, ur 20 (A) NEGATIVE mg/dL   Protein, ur 30 (A) NEGATIVE mg/dL   Nitrite NEGATIVE NEGATIVE   Leukocytes,Ua LARGE (A) NEGATIVE   RBC / HPF 0-5 0 - 5 RBC/hpf   WBC, UA >50 0 - 5 WBC/hpf   Bacteria, UA MANY (A) NONE SEEN   Squamous Epithelial / HPF 11-20 0 - 5 /HPF   WBC Clumps PRESENT    Mucus PRESENT     Comment: Performed at [facility], [address], [place], [place] [address]  SARS Coronavirus 2 by RT PCR (hospital order, performed in [facility] hospital lab) *cepheid single result test* Anterior Nasal Swab     Status: None   Collection Time: 10/20/22  1:03 AM   Specimen: Anterior Nasal Swab  Result Value Ref Range   SARS Coronavirus 2 by RT PCR NEGATIVE NEGATIVE    Comment: (NOTE) SARS-CoV-2 target nucleic acids are NOT  DETECTED.  The SARS-CoV-2 RNA is generally detectable in upper and lower respiratory specimens during the acute phase of infection. The lowest concentration of SARS-CoV-2 viral copies this assay can detect is 250 copies / mL. A negative result does not preclude SARS-CoV-2 infection and should not be used as the sole basis for treatment or other patient management decisions.  A negative result may occur with improper specimen collection / handling, submission of specimen other than nasopharyngeal swab, presence of viral mutation(s) within the areas targeted by this assay, and inadequate number of viral copies (<250 copies / mL). A negative result must be combined with clinical observations, patient history, and epidemiological information.  Fact Sheet for Patients:   RoadLapTop.co.za  Fact Sheet for Healthcare Providers: http://kim-miller.com/  This test is not yet approved or  cleared by the [place] FDA and has been authorized for detection and/or diagnosis of SARS-CoV-2 by FDA under an Emergency Use Authorization (EUA).  This EUA will remain in effect (meaning this test can be used) for the duration of the COVID-19 declaration under Section 564(b)(1) of the Act, 21 U.S.C. section 360bbb-3(b)(1), unless the authorization is terminated or revoked sooner.  Performed at [facility], [address] [place], [place] [address]   Urine Culture     Status: Abnormal   Collection Time: 10/20/22  8:43 AM   Specimen: Urine, Clean Catch  Result Value Ref Range   Specimen Description      URINE, CLEAN CATCH Performed at [facility], [address], [place], [place] [address]    Special Requests      Immunocompromised Performed at [facility], [address] [place], [place] [address]    Culture >=100,000 COLONIES/mL ESCHERICHIA COLI (A)    Report Status 10/22/2022 FINAL     Organism ID, Bacteria ESCHERICHIA COLI (A)       Susceptibility   Escherichia coli - MIC*    AMPICILLIN <=2 SENSITIVE Sensitive     CEFAZOLIN <=4 SENSITIVE Sensitive     CEFEPIME <=0.12 SENSITIVE Sensitive     CEFTRIAXONE <=0.25 SENSITIVE Sensitive     CIPROFLOXACIN <=0.25 SENSITIVE Sensitive     GENTAMICIN <=1 SENSITIVE Sensitive     IMIPENEM <=0.25 SENSITIVE Sensitive     NITROFURANTOIN <=16 SENSITIVE Sensitive     TRIMETH/SULFA <=20 SENSITIVE Sensitive     AMPICILLIN/SULBACTAM <=2 SENSITIVE  Sensitive     PIP/TAZO <=4 SENSITIVE Sensitive     * >=100,000 COLONIES/mL ESCHERICHIA COLI  POCT glycosylated hemoglobin (Hb A1C)     Status: Abnormal   Collection Time: 11/08/22  2:53 PM  Result Value Ref Range   Hemoglobin A1C 13.5 (A) 4.0 - 5.6 %   HbA1c POC (<> result, manual entry)     HbA1c, POC (prediabetic range)     HbA1c, POC (controlled diabetic range)    HIV antibody (with reflex)     Status: None   Collection Time: 11/08/22  3:19 PM  Result Value Ref Range   HIV 1&2 Ab, 4th Generation NON-REACTIVE NON-REACTIVE    Comment: HIV-1 antigen and HIV-1/HIV-2 antibodies were not detected. There is no laboratory evidence of HIV infection. Marland Kitchen PLEASE NOTE: This information has been disclosed to you from records whose confidentiality may be protected by state law.  If your state requires such protection, then the state law prohibits you from making any further disclosure of the information without the specific written consent of the person to whom it pertains, or as otherwise permitted by law. A general authorization for the release of medical or other information is NOT sufficient for this purpose. . For additional information please refer to http://education.questdiagnostics.com/faq/FAQ106 (This link is being provided for informational/ educational purposes only.) . Marland Kitchen The performance of this assay has not been clinically validated in patients less than 32 years old. .    Hepatitis C Antibody     Status: Abnormal   Collection Time: 11/08/22  3:19 PM  Result Value Ref Range   Hepatitis C Ab BORDERLINE (A) NON-REACTIVE    Comment: Verified by repeat analysis. . . Based on this result, the sample will be tested for HCV RNA by a Nucleic Acid Amplification Test (NAAT) to determine if the patient has a current active infection. Marland Kitchen   HCV RNA, Quantitative Real Time PCR     Status: None   Collection Time: 11/08/22  3:19 PM  Result Value Ref Range   HCV RNA, PCR, QN <15 NOT DETECTED NOT DETECTED IU/mL   HCV Quantitative Log <1.18 NOT DETECTED NOT DETECTED Log IU/mL    Comment: . HCV RNA is not detected.  There is no laboratory  evidence of a current active HCV infection.  . This pattern of results (undetectable HCV RNA combined with reactive HCV antibody) could be consistent with a resolved past infection if the clinical history is  compatible with previous HCV exposure.  However, if no previous exposure is suspected, the reactive HCV  antibody could be a biological false positive result. . For more information on this test, go to: http://education.questdiagnostics.com/faq/FAQ22v1 (This link is being provided for informational/ educational purposes only.) . This assay is intended for use as an aid in the diagnosis of HCV infection and the management of HCV infected patients undergoing anti-viral therapy. .   Microalbumin / creatinine urine ratio     Status: None   Collection Time: 11/08/22  3:20 PM  Result Value Ref Range   Microalb, Ur 1.7 0.0 - 1.9 mg/dL   Creatinine,U 19.1 mg/dL   Microalb Creat Ratio 3.9 0.0 - 30.0 mg/g  Urinalysis w microscopic + reflex cultur     Status: Abnormal   Collection Time: 11/08/22  3:20 PM   Specimen: Urine  Result Value Ref Range   Color, Urine YELLOW YELLOW   APPearance TURBID (A) CLEAR   Specific Gravity, Urine 1.038 (H) 1.001 - 1.035   pH 6.0  5.0 - 8.0   Glucose, UA 3+ (A) NEGATIVE   Bilirubin Urine  NEGATIVE NEGATIVE   Ketones, ur NEGATIVE NEGATIVE   Hgb urine dipstick TRACE (A) NEGATIVE   Protein, ur NEGATIVE NEGATIVE   Nitrites, Initial NEGATIVE NEGATIVE   Leukocyte Esterase 2+ (A) NEGATIVE   WBC, UA > OR = 60 (A) 0 - 5 /HPF   RBC / HPF 0-2 0 - 2 /HPF   Squamous Epithelial / HPF NONE SEEN < OR = 5 /HPF   Bacteria, UA MANY (A) NONE SEEN /HPF   Hyaline Cast NONE SEEN NONE SEEN /LPF   Note      Comment: This urine was analyzed for the presence of WBC,  RBC, bacteria, casts, and other formed elements.  Only those elements seen were reported. . .   Urine Culture     Status: Abnormal   Collection Time: 11/08/22  3:20 PM  Result Value Ref Range   MICRO NUMBER: [id number]    SPECIMEN QUALITY: Adequate    Sample Source URINE    STATUS: FINAL    ISOLATE 1: Escherichia coli (A)     Comment: Greater than 100,000 CFU/mL of Escherichia coli      Susceptibility   Escherichia coli - URINE CULTURE, REFLEX    AMOX/CLAVULANIC <=2 Sensitive     AMPICILLIN <=2 Sensitive     AMPICILLIN/SULBACTAM <=2 Sensitive     CEFAZOLIN* <=4 Not Reportable      * For infections other than uncomplicated UTI caused by E. coli, K. pneumoniae or P. mirabilis: Cefazolin is resistant if MIC > or = 8 mcg/mL. (Distinguishing susceptible versus intermediate for isolates with MIC < or = 4 mcg/mL requires additional testing.) For uncomplicated UTI caused by E. coli, K. pneumoniae or P. mirabilis: Cefazolin is susceptible if MIC <32 mcg/mL and predicts susceptible to the oral agents cefaclor, cefdinir, cefpodoxime, cefprozil, cefuroxime, cephalexin and loracarbef.     CEFTAZIDIME <=1 Sensitive     CEFEPIME <=1 Sensitive     CEFTRIAXONE <=1 Sensitive     CIPROFLOXACIN <=0.25 Sensitive     LEVOFLOXACIN <=0.12 Sensitive     GENTAMICIN <=1 Sensitive     IMIPENEM <=0.25 Sensitive     NITROFURANTOIN <=16 Sensitive     PIP/TAZO <=4 Sensitive     TOBRAMYCIN <=1 Sensitive     TRIMETH/SULFA* <=20 Sensitive       * For infections other than uncomplicated UTI caused by E. coli, K. pneumoniae or P. mirabilis: Cefazolin is resistant if MIC > or = 8 mcg/mL. (Distinguishing susceptible versus intermediate for isolates with MIC < or = 4 mcg/mL requires additional testing.) For uncomplicated UTI caused by E. coli, K. pneumoniae or P. mirabilis: Cefazolin is susceptible if MIC <32 mcg/mL and predicts susceptible to the oral agents cefaclor, cefdinir, cefpodoxime, cefprozil, cefuroxime, cephalexin and loracarbef. Legend: S = Susceptible  I = Intermediate R = Resistant  NS = Not susceptible SDD = Susceptible Dose Dependent * = Not Tested  NR = Not Reported **NN = See Therapy Comments   REFLEXIVE URINE CULTURE     Status: None   Collection Time: 11/08/22  3:20 PM  Result Value Ref Range   REFLEXIVE URINE CULTURE      Comment: CULTURE INDICATED - RESULTS TO FOLLOW  Hepatitis B surface antigen     Status: None   Collection Time: 11/17/22  8:57 AM  Result Value Ref Range   Hepatitis B Surface Ag NON-REACTIVE NON-REACTIVE    Comment: . For  additional information, please refer to  http://education.[facility].com/faq/FAQ202  (This link is being provided for informational/ educational purposes only.) .   Hepatitis B surface antibody,qualitative     Status: None   Collection Time: 11/17/22  8:57 AM  Result Value Ref Range   Hep B S Ab NON-REACTIVE NON-REACTIVE  Hepatitis B core antibody, total     Status: Abnormal   Collection Time: 11/17/22  8:57 AM  Result Value Ref Range   Hep B Core Total Ab REACTIVE (A) NON-REACTIVE    Comment: . For additional information, please refer to  http://education.[facility].com/faq/FAQ202  (This link is being provided for informational/ educational purposes only.) .   Comprehensive metabolic panel     Status: Abnormal   Collection Time: 11/17/22  8:57 AM  Result Value Ref Range   Sodium 138 135 - 145 mEq/L   Potassium 4.5 3.5 - 5.1 mEq/L    Chloride 103 96 - 112 mEq/L   CO2 24 19 - 32 mEq/L   Glucose, Bld 178 (H) 70 - 99 mg/dL   BUN 20 6 - 23 mg/dL   Creatinine, Ser 8.65 0.40 - 1.20 mg/dL   Total Bilirubin 0.8 0.2 - 1.2 mg/dL   Alkaline Phosphatase 74 39 - 117 U/L   AST 56 (H) 0 - 37 U/L   ALT 80 (H) 0 - 35 U/L   Total Protein 7.5 6.0 - 8.3 g/dL   Albumin 3.8 3.5 - 5.2 g/dL   GFR 78.46 >96.29 mL/min    Comment: Calculated using the CKD-EPI Creatinine Equation (2021)   Calcium 9.5 8.4 - 10.5 mg/dL  CBC with Differential/Platelet     Status: Abnormal   Collection Time: 11/17/22  8:57 AM  Result Value Ref Range   WBC 13.3 (H) 4.0 - 10.5 K/uL   RBC 5.10 3.87 - 5.11 Mil/uL   Hemoglobin 15.9 (H) 12.0 - 15.0 g/dL   HCT 52.8 (H) 41.3 - 24.4 %   MCV 93.6 78.0 - 100.0 fl   MCHC 33.2 30.0 - 36.0 g/dL   RDW 01.0 27.2 - 53.6 %   Platelets 349.0 150.0 - 400.0 K/uL   Neutrophils Relative % 50.7 43.0 - 77.0 %   Lymphocytes Relative 37.5 12.0 - 46.0 %   Monocytes Relative 8.0 3.0 - 12.0 %   Eosinophils Relative 3.1 0.0 - 5.0 %   Basophils Relative 0.7 0.0 - 3.0 %   Neutro Abs 6.7 1.4 - 7.7 K/uL   Lymphs Abs 5.0 (H) 0.7 - 4.0 K/uL   Monocytes Absolute 1.1 (H) 0.1 - 1.0 K/uL   Eosinophils Absolute 0.4 0.0 - 0.7 K/uL   Basophils Absolute 0.1 0.0 - 0.1 K/uL  Lipid panel     Status: None   Collection Time: 11/17/22  8:57 AM  Result Value Ref Range   Cholesterol 165 0 - 200 mg/dL    Comment: ATP III Classification       Desirable:  < 200 mg/dL               Borderline High:  200 - 239 mg/dL          High:  > = 644 mg/dL   Triglycerides 034.7 0.0 - 149.0 mg/dL    Comment: Normal:  <425 mg/dLBorderline High:  150 - 199 mg/dL   HDL 95.63 >87.56 mg/dL   VLDL 43.3 0.0 - 29.5 mg/dL   LDL Cholesterol 87 0 - 99 mg/dL   Total CHOL/HDL Ratio 3     Comment:  Men          Women1/2 Average Risk     3.4          3.3Average Risk          5.0          4.42X Average Risk          9.6          7.13X Average Risk          15.0           11.0                       NonHDL 112.43     Comment: NOTE:  Non-HDL goal should be 30 mg/dL higher than patient's LDL goal (i.e. LDL goal of < 70 mg/dL, would have non-HDL goal of < 100 mg/dL)  TSH     Status: None   Collection Time: 11/17/22  8:57 AM  Result Value Ref Range   TSH 0.66 0.35 - 5.50 uIU/mL  Urinalysis w microscopic + reflex cultur     Status: Abnormal   Collection Time: 12/04/22 10:22 AM   Specimen: Urine  Result Value Ref Range   Color, Urine YELLOW YELLOW   APPearance CLOUDY (A) CLEAR   Specific Gravity, Urine 1.024 1.001 - 1.035   pH < OR = 5.0 5.0 - 8.0   Glucose, UA NEGATIVE NEGATIVE   Bilirubin Urine NEGATIVE NEGATIVE   Ketones, ur NEGATIVE NEGATIVE   Hgb urine dipstick NEGATIVE NEGATIVE   Protein, ur NEGATIVE NEGATIVE   Nitrites, Initial NEGATIVE NEGATIVE   Leukocyte Esterase 2+ (A) NEGATIVE   WBC, UA > OR = 60 (A) 0 - 5 /HPF   RBC / HPF 0-2 0 - 2 /HPF   Squamous Epithelial / HPF 10-20 (A) < OR = 5 /HPF   Bacteria, UA MANY (A) NONE SEEN /HPF   Hyaline Cast NONE SEEN NONE SEEN /LPF   Note      Comment: This urine was analyzed for the presence of WBC,  RBC, bacteria, casts, and other formed elements.  Only those elements seen were reported. . .   Uric acid     Status: None   Collection Time: 12/04/22 10:22 AM  Result Value Ref Range   Uric Acid, Serum 5.2 2.4 - 7.0 mg/dL  Sedimentation rate     Status: Abnormal   Collection Time: 12/04/22 10:22 AM  Result Value Ref Range   Sed Rate 59 (H) 0 - 30 mm/hr  CRP High sensitivity     Status: Abnormal   Collection Time: 12/04/22 10:22 AM  Result Value Ref Range   CRP, High Sensitivity 5.450 (H) 0.000 - 5.000 mg/L    Comment: Note:  An elevated hs-CRP (>5 mg/L) should be repeated after 2 weeks to rule out recent infection or trauma.  Hep B Core Ab W/Reflex     Status: None   Collection Time: 12/04/22 10:22 AM  Result Value Ref Range   Hep B Core Total Ab Negative Negative  Urine Culture     Status:  Abnormal   Collection Time: 12/04/22 10:22 AM  Result Value Ref Range   MICRO NUMBER: [id number]    SPECIMEN QUALITY: Adequate    Sample Source URINE    STATUS: FINAL    ISOLATE 1: Escherichia coli (A)     Comment: Greater than 100,000 CFU/mL of Escherichia coli      Susceptibility   Escherichia coli -  URINE CULTURE, REFLEX    AMOX/CLAVULANIC <=2 Sensitive     AMPICILLIN <=2 Sensitive     AMPICILLIN/SULBACTAM <=2 Sensitive     CEFAZOLIN* <=4 Not Reportable      * For infections other than uncomplicated UTI caused by E. coli, K. pneumoniae or P. mirabilis: Cefazolin is resistant if MIC > or = 8 mcg/mL. (Distinguishing susceptible versus intermediate for isolates with MIC < or = 4 mcg/mL requires additional testing.) For uncomplicated UTI caused by E. coli, K. pneumoniae or P. mirabilis: Cefazolin is susceptible if MIC <32 mcg/mL and predicts susceptible to the oral agents cefaclor, cefdinir, cefpodoxime, cefprozil, cefuroxime, cephalexin and loracarbef.     CEFTAZIDIME <=1 Sensitive     CEFEPIME <=1 Sensitive     CEFTRIAXONE <=1 Sensitive     CIPROFLOXACIN <=0.25 Sensitive     LEVOFLOXACIN <=0.12 Sensitive     GENTAMICIN <=1 Sensitive     IMIPENEM <=0.25 Sensitive     NITROFURANTOIN <=16 Sensitive     PIP/TAZO <=4 Sensitive     TOBRAMYCIN <=1 Sensitive     TRIMETH/SULFA* <=20 Sensitive      * For infections other than uncomplicated UTI caused by E. coli, K. pneumoniae or P. mirabilis: Cefazolin is resistant if MIC > or = 8 mcg/mL. (Distinguishing susceptible versus intermediate for isolates with MIC < or = 4 mcg/mL requires additional testing.) For uncomplicated UTI caused by E. coli, K. pneumoniae or P. mirabilis: Cefazolin is susceptible if MIC <32 mcg/mL and predicts susceptible to the oral agents cefaclor, cefdinir, cefpodoxime, cefprozil, cefuroxime, cephalexin and loracarbef. Legend: S = Susceptible  I = Intermediate R = Resistant  NS = Not susceptible SDD =  Susceptible Dose Dependent * = Not Tested  NR = Not Reported **NN = See Therapy Comments   REFLEXIVE URINE CULTURE     Status: None   Collection Time: 12/04/22 10:22 AM  Result Value Ref Range   REFLEXIVE URINE CULTURE      Comment: CULTURE INDICATED - RESULTS TO FOLLOW  Urinalysis, Routine w reflex microscopic     Status: Abnormal   Collection Time: 12/05/22 10:40 AM  Result Value Ref Range   Specific Gravity, UA 1.020 1.005 - 1.030   pH, UA 7.0 5.0 - 7.5   Color, UA Yellow Yellow   Appearance Ur Cloudy (A) Clear   Leukocytes,UA 2+ (A) Negative   Protein,UA Negative Negative/Trace   Glucose, UA Negative Negative   Ketones, UA Negative Negative   RBC, UA Trace (A) Negative   Bilirubin, UA Negative Negative   Urobilinogen, Ur 1.0 0.2 - 1.0 mg/dL   Nitrite, UA Negative Negative   Microscopic Examination See below:   Microscopic Examination     Status: Abnormal   Collection Time: 12/05/22 10:40 AM   Urine  Result Value Ref Range   WBC, UA >30 (A) 0 - 5 /hpf   RBC, Urine 0-2 0 - 2 /hpf   Epithelial Cells (non renal) 0-10 0 - 10 /hpf   Mucus, UA Present Not Estab.   Bacteria, UA Few (A) None seen/Few        Garner Nash, MD, MS

## 2023-01-16 NOTE — Assessment & Plan Note (Signed)
Under management with prophylactic nitrofurantoin; patient ran out of medication one week ago.  Plan: Informed still has refills on prescription Continue nitrofurantoin (Macrobid) 100 mg once daily at bedtime. Patient to follow up with urology tomorrow for ongoing management.

## 2023-01-16 NOTE — Assessment & Plan Note (Signed)
Patient experiencing stress and difficulty managing mood due to financial constraints and personal circumstances. Declines formal counseling but utilizes support from a licensed counselor friend.  Plan: Monitor mood and stress levels Offer support and monitor emotional well-being. Reassess openness to formal counseling in future visits.

## 2023-01-16 NOTE — Assessment & Plan Note (Signed)
Ongoing diarrhea possibly due to antibiotic-associated colitis or Clostridioides difficile infection following multiple antibiotic courses.  Plan: Order stool PCR test for Clostridioides difficile. Encourage maintaining hydration. Follow up on stool test results; patient has a gastroenterology appointment scheduled for January 6th.

## 2023-01-17 ENCOUNTER — Ambulatory Visit: Payer: BC Managed Care – PPO | Admitting: Urology

## 2023-01-17 ENCOUNTER — Encounter: Payer: Self-pay | Admitting: Urology

## 2023-01-17 VITALS — BP 139/79 | HR 88 | Ht 64.0 in | Wt 230.0 lb

## 2023-01-17 DIAGNOSIS — Z87440 Personal history of urinary (tract) infections: Secondary | ICD-10-CM

## 2023-01-17 DIAGNOSIS — N390 Urinary tract infection, site not specified: Secondary | ICD-10-CM

## 2023-01-17 DIAGNOSIS — Z09 Encounter for follow-up examination after completed treatment for conditions other than malignant neoplasm: Secondary | ICD-10-CM

## 2023-01-17 LAB — URINALYSIS, ROUTINE W REFLEX MICROSCOPIC
Bilirubin, UA: NEGATIVE
Ketones, UA: NEGATIVE
Leukocytes,UA: NEGATIVE
Nitrite, UA: NEGATIVE
Protein,UA: NEGATIVE
RBC, UA: NEGATIVE
Specific Gravity, UA: 1.01 (ref 1.005–1.030)
Urobilinogen, Ur: 0.2 mg/dL (ref 0.2–1.0)
pH, UA: 5 (ref 5.0–7.5)

## 2023-01-17 NOTE — Progress Notes (Signed)
Assessment: 1. Frequent UTI     Plan: Continue methods to reduce the risk of UTIs discussed including fluid intake, timed and double voiding, daily cranberry supplement, and daily probiotic. Continue management of her constipation to improve bladder emptying and reduce the risk of UTIs. Continue daily Macrobid for UTI prevention Return to office in 2 months  Chief Complaint:  Chief Complaint  Patient presents with   Recurrent UTI    History of Present Illness:  [name] is a 52 y.o. female who is seen for further evaluation of UTI's. She reported a history of UTI symptoms for approximately 1 year.  She did not seek medical attention as she did not have insurance. She presented to the emergency room on 10/20/2022 with abdominal pain, frequency and dysuria.  She was also having some low back pain.  No fevers or chills.  White cell count was elevated to 14.7. CT imaging showed no renal or ureteral calculi, no evidence of obstruction, subtle regions of cortical hypoenhancement in the right kidney consistent with pyelonephritis, small amount of air within the bladder. Urine culture results: 10/20/2022 >100 K E. coli, pansensitive - treated with Rocephin and Duracef 11/09/2022 >100 K E. Coli - treated with Keflex 12/04/22 >100K E. Coli - treated with Cipro  She noted improvement in her symptoms while on the antibiotics.  Her urinary symptoms return shortly after completion of the antibiotic.  She was having some intermittent frequency and dysuria.  No recent gross hematuria.  She reported noting some mucus in the urine.  She also reported passing "sand" in her urine recently.  She does report a prior history of kidney stones. She was started on daily Macrobid in 10/24.  She returns today for follow-up.  She reports that she was doing well on the daily antibiotic.  For some reason, she was unable to get a refill and has been out of the medicine for a little over a week.  She is not  having any UTI symptoms at the present time.  No dysuria or gross hematuria.  Portions of the above documentation were copied from a prior visit for review purposes only.   Past Medical History:  Past Medical History:  Diagnosis Date   ALLERGIC RHINITIS    Allergy    Anemia    Anxiety    CHF (congestive heart failure) (HCC)    DEPRESSION    DIABETES MELLITUS, TYPE II    HEPATITIS C    Hx MRSA infection    HYPERTENSION    HYPONATREMIA, CHRONIC    Irritable bowel syndrome    LEUKOCYTOSIS UNSPECIFIED    chronic s/p heme eval ([facility] 2010)   MIGRAINE, UNSPEC., W/O INTRACTABLE MIGRAINE    OSA (obstructive sleep apnea)    CPAP qhs   Puncture wound of right foot    penetrated 3/4 of an inch   RENAL CALCULUS, HX OF    Sleep apnea    Substance abuse (HCC)    TOBACCO DEPENDENCE    Ulcer     Past Surgical History:  Past Surgical History:  Procedure Laterality Date   ABDOMINAL HYSTERECTOMY  02/20/2005   LIVER BIOPSY  02/20/1997   TUBAL LIGATION  02/20/1993    Allergies:  Allergies  Allergen Reactions   Dilaudid [Hydromorphone]    Erythromycin Nausea And Vomiting    Stomach upset   Ginseng Nausea And Vomiting and Swelling   Mushroom Extract Complex Nausea And Vomiting   Pineapple Hives    unknown  Sulfa Antibiotics Hives and Itching    itching   Other Hives and Rash    Walnut    Family History:  Family History  Problem Relation Age of Onset   Diabetes Mother    Miscarriages / Stillbirths Mother    Obesity Mother    Mental illness Father    Alcohol abuse Father    Anxiety disorder Sister    Depression Sister    Obesity Sister    Breast cancer Maternal Grandmother    Cancer Maternal Grandmother    Alcohol abuse Other        parents   Colon cancer Other        grandparent   Lung cancer Other        grandparent   Ovarian cancer Other        grandparent   Heart disease Other        parent   Alcohol abuse Maternal Grandfather    Hearing loss Paternal  Grandfather    Anxiety disorder Sister    Obesity Sister     Social History:  Social History   Tobacco Use   Smoking status: Former    Current packs/day: 0.00    Average packs/day: 1 pack/day for 25.0 years (25.0 ttl pk-yrs)    Types: Cigarettes    Quit date: 04/06/2017    Years since quitting: 5.7   Smokeless tobacco: Never  Vaping Use   Vaping status: Never Used  Substance Use Topics   Alcohol use: No   Drug use: No    ROS: Constitutional:  Negative for fever, chills, weight loss CV: Negative for chest pain, previous MI, hypertension Respiratory:  Negative for shortness of breath, wheezing, sleep apnea, frequent cough GI:  Negative for nausea, vomiting, bloody stool, GERD  Physical exam: BP 139/79   Pulse 88   Ht 5\' 4"  (1.626 m)   Wt 230 lb (104.3 kg)   BMI 39.48 kg/m  GENERAL APPEARANCE:  Well appearing, well developed, well nourished, NAD HEENT:  Atraumatic, normocephalic, oropharynx clear NECK:  Supple without lymphadenopathy or thyromegaly ABDOMEN:  Soft, non-tender, no masses EXTREMITIES:  Moves all extremities well, without clubbing, cyanosis, or edema NEUROLOGIC:  Alert and oriented x 3, normal gait, CN II-XII grossly intact MENTAL STATUS:  appropriate BACK:  Non-tender to palpation, No CVAT SKIN:  Warm, dry, and intact  Results: U/A: trace glucose

## 2023-01-31 ENCOUNTER — Other Ambulatory Visit: Payer: Self-pay

## 2023-01-31 NOTE — Progress Notes (Signed)
   01/31/2023  Patient ID: [name], female   DOB: February 03, 1971, 52 y.o.   MRN: [id number]  Late making outreach attempt for today's follow-up visit, and I was not able to reach the patient.  I was able to leave a voicemail with my direct phone number; and I am sending a message to see if we can reschedule visit.  Lenna Gilford, PharmD, DPLA

## 2023-02-01 ENCOUNTER — Other Ambulatory Visit: Payer: BC Managed Care – PPO

## 2023-02-04 LAB — GI PROFILE, STOOL, PCR

## 2023-02-26 ENCOUNTER — Encounter: Payer: Self-pay | Admitting: Endocrinology

## 2023-02-26 ENCOUNTER — Ambulatory Visit: Payer: BC Managed Care – PPO | Admitting: Endocrinology

## 2023-02-26 VITALS — BP 136/60 | HR 80 | Resp 20 | Ht 64.0 in | Wt 236.6 lb

## 2023-02-26 DIAGNOSIS — Z7984 Long term (current) use of oral hypoglycemic drugs: Secondary | ICD-10-CM

## 2023-02-26 DIAGNOSIS — E1142 Type 2 diabetes mellitus with diabetic polyneuropathy: Secondary | ICD-10-CM | POA: Diagnosis not present

## 2023-02-26 LAB — POCT GLYCOSYLATED HEMOGLOBIN (HGB A1C): Hemoglobin A1C: 7.9 % — AB (ref 4.0–5.6)

## 2023-02-26 MED ORDER — METFORMIN HCL ER 500 MG PO TB24: 2000.0000 mg | ORAL_TABLET | Freq: Every day | ORAL | 2 refills | Status: AC

## 2023-02-26 MED ORDER — PIOGLITAZONE HCL 30 MG PO TABS
30.0000 mg | ORAL_TABLET | Freq: Every day | ORAL | 3 refills | Status: AC
Start: 2023-02-26 — End: 2024-02-21

## 2023-02-26 NOTE — Progress Notes (Signed)
 Outpatient Endocrinology Note Hadleigh Felber, MD   Patient's Name: [name]    DOB: 1971-01-08    MRN: [id number]                                                    REASON OF VISIT: New consult for type 2 diabetes mellitus  REFERRING PROVIDER: Sebastian Beverley NOVAK, MD   PCP: Sebastian Beverley NOVAK, MD  HISTORY OF PRESENT ILLNESS:   [name] is a 53 y.o. old female with past medical history listed below, is here for new consult for type 2 diabetes mellitus.   Pertinent Diabetes History: Patient was diagnosed with type 2 diabetes mellitus at the age of 13s.  Patient states she was not taking care of diabetes properly intermittently in the past , leading to variable control of diabetes mellitus.  She has uncontrolled type 2 diabetes mellitus with hemoglobin A1c of 13.5% in September 2024.  Patient is referred and presented today for the evaluation and management of uncontrolled type 2 diabetes mellitus.  Patient had taken Mounjaro  in November however had significant sickness and diarrhea and stopped it.  She is currently on metformin  and pioglitazone  however she reports she has not taken for about 5 days.   Chronic Diabetes Complications : Retinopathy: Unknown. Last ophthalmology exam was done on Due, was referred to ophthalmology.  Nephropathy: no Peripheral neuropathy: yes, complains of numbness, tingling and pain in the feet. Coronary artery disease: no Stroke: no  Relevant comorbidities and cardiovascular risk factors: Obesity: yes Body mass index is 40.61 kg/m.  Hypertension: no  Hyperlipidemia : Yes, not on statin   Current / Home Diabetic regimen includes: Metformin  1000 mg 2 times a day.  Mostly forgetting the second dose of the day. Pioglitazone /Actos  45 mg daily.  Prior diabetic medications: Mounjaro  stopped due to significant sickness/diarrhea.  Glycemic data:    No glucometer data to review.  She reports blood sugar yesterday morning was 149.  She reports blood  sugar averaging around 170.  She has not been checking blood sugar regularly at home.  Hypoglycemia: Patient has no hypoglycemic episodes. Patient has hypoglycemia awareness.  Factors modifying glucose control: 1.  Diabetic diet assessment: 3 meals a day however she has been restricting calorie lately.  2.  Staying active or exercising: No formal exercise.  3.  Medication compliance: compliant some of the time.  Interval history  Diabetes regimen as noted above.  She reports has not been taking metformin  and pioglitazone  for about 5 days.  She has been improving her diet, doing calorie restriction.  She had noted blood sugar has been improving.  She used to have blood sugar up to 400-600 range.  Lately she has a blood sugar less than 200s.  She has complaints of numbness, tingling and pain in the feet however has been improving lately.  Denies vision problem.  REVIEW OF SYSTEMS As per history of present illness.   PAST MEDICAL HISTORY: Past Medical History:  Diagnosis Date   ALLERGIC RHINITIS    Allergy    Anemia    Anxiety    CHF (congestive heart failure) (HCC)    DEPRESSION    DIABETES MELLITUS, TYPE II    HEPATITIS C    Hx MRSA infection    HYPERTENSION    HYPONATREMIA, CHRONIC    Irritable bowel syndrome  LEUKOCYTOSIS UNSPECIFIED    chronic s/p heme eval (odogwu 2010)   MIGRAINE, UNSPEC., W/O INTRACTABLE MIGRAINE    OSA (obstructive sleep apnea)    CPAP qhs   Puncture wound of right foot    penetrated 3/4 of an inch   RENAL CALCULUS, HX OF    Sleep apnea    Substance abuse (HCC)    TOBACCO DEPENDENCE    Ulcer     PAST SURGICAL HISTORY: Past Surgical History:  Procedure Laterality Date   ABDOMINAL HYSTERECTOMY  02/20/2005   LIVER BIOPSY  02/20/1997   TUBAL LIGATION  02/20/1993    ALLERGIES: Allergies  Allergen Reactions   Dilaudid [Hydromorphone]    Erythromycin Nausea And Vomiting    Stomach upset   Ginseng Nausea And Vomiting and Swelling    Mushroom Extract Complex (Do Not Select) Nausea And Vomiting   Pineapple Hives    unknown   Sulfa Antibiotics Hives and Itching    itching   Other Hives and Rash    Walnut    FAMILY HISTORY:  Family History  Problem Relation Age of Onset   Diabetes Mother    Miscarriages / Stillbirths Mother    Obesity Mother    Mental illness Father    Alcohol abuse Father    Anxiety disorder Sister    Depression Sister    Obesity Sister    Breast cancer Maternal Grandmother    Cancer Maternal Grandmother    Alcohol abuse Other        parents   Colon cancer Other        grandparent   Lung cancer Other        grandparent   Ovarian cancer Other        grandparent   Heart disease Other        parent   Alcohol abuse Maternal Grandfather    Hearing loss Paternal Grandfather    Anxiety disorder Sister    Obesity Sister     SOCIAL HISTORY: Social History   Socioeconomic History   Marital status: Divorced    Spouse name: n/a   Number of children: 1   Years of education: Not on file   Highest education level: Bachelor's degree (e.g., BA, AB, BS)  Occupational History   Occupation: Marine Scientist  Tobacco Use   Smoking status: Former    Current packs/day: 0.00    Average packs/day: 1 pack/day for 25.0 years (25.0 ttl pk-yrs)    Types: Cigarettes    Quit date: 04/06/2017    Years since quitting: 5.8   Smokeless tobacco: Never  Vaping Use   Vaping status: Never Used  Substance and Sexual Activity   Alcohol use: No   Drug use: No   Sexual activity: Yes    Birth control/protection: None    Comment: I have no uterus, and am careful about STIs  Other Topics Concern   Not on file  Social History Narrative   Lives with 9 animals, and is currently fostering 3 kittens   Social Drivers of Corporate Investment Banker Strain: Low Risk  (12/01/2022)   Overall Financial Resource Strain (CARDIA)    Difficulty of Paying Living Expenses: Not very hard  Recent Concern: Financial Resource  Strain - Medium Risk (11/06/2022)   Overall Financial Resource Strain (CARDIA)    Difficulty of Paying Living Expenses: Somewhat hard  Food Insecurity: No Food Insecurity (12/01/2022)   Hunger Vital Sign    Worried About Running Out of Food in the  Last Year: Never true    Ran Out of Food in the Last Year: Never true  Transportation Needs: No Transportation Needs (12/01/2022)   PRAPARE - Administrator, Civil Service (Medical): No    Lack of Transportation (Non-Medical): No  Physical Activity: Inactive (12/01/2022)   Exercise Vital Sign    Days of Exercise per Week: 0 days    Minutes of Exercise per Session: 150+ min  Stress: Stress Concern Present (12/01/2022)   [employer] of Occupational Health - Occupational Stress Questionnaire    Feeling of Stress : To some extent  Social Connections: Moderately Isolated (12/01/2022)   Social Connection and Isolation Panel [NHANES]    Frequency of Communication with Friends and Family: More than three times a week    Frequency of Social Gatherings with Friends and Family: Once a week    Attends Religious Services: 1 to 4 times per year    Active Member of [employer] or Organizations: No    Attends Engineer, Structural: Not on file    Marital Status: Divorced    MEDICATIONS:  Current Outpatient Medications  Medication Sig Dispense Refill   Blood Glucose Monitoring Suppl DEVI 1 each by Does not apply route in the morning, at noon, and at bedtime. May substitute to any manufacturer covered by patient's insurance. 1 each 0   ibuprofen  (ADVIL ,MOTRIN ) 200 MG tablet Take 800 mg by mouth every 6 (six) hours as needed for pain.     metFORMIN  (GLUCOPHAGE -XR) 500 MG 24 hr tablet Take 4 tablets (2,000 mg total) by mouth daily with breakfast. 360 tablet 2   nitrofurantoin , macrocrystal-monohydrate, (MACROBID ) 100 MG capsule Take 1 capsule (100 mg total) by mouth daily. 30 capsule 2   pioglitazone  (ACTOS ) 30 MG tablet Take 1 tablet (30  mg total) by mouth daily. 90 tablet 3   No current facility-administered medications for this visit.    PHYSICAL EXAM: Vitals:   02/26/23 1403  BP: 136/60  Pulse: 80  Resp: 20  SpO2: 97%  Weight: 236 lb 9.6 oz (107.3 kg)  Height: 5' 4 (1.626 m)   Body mass index is 40.61 kg/m.  Wt Readings from Last 3 Encounters:  02/26/23 236 lb 9.6 oz (107.3 kg)  01/17/23 230 lb (104.3 kg)  01/16/23 231 lb 6.4 oz (105 kg)    General: Well developed, well nourished female in no apparent distress.  HEENT: AT/[place], no external lesions.  Eyes: Conjunctiva clear and no icterus. Neck: Neck supple  Lungs: Respirations not labored Neurologic: Alert, oriented, normal speech Extremities / Skin: Dry. No sores or rashes noted. No acanthosis nigricans. Pedal exam + Psychiatric: Does not appear depressed or anxious  Diabetic Foot Exam - Simple   Simple Foot Form Diabetic Foot exam was performed with the following findings: Yes 02/26/2023  2:35 PM  Visual Inspection No deformities, no ulcerations, no other skin breakdown bilaterally: Yes Sensation Testing Intact to touch and monofilament testing bilaterally: Yes Pulse Check Posterior Tibialis and Dorsalis pulse intact bilaterally: Yes Comments    LABS Reviewed Lab Results  Component Value Date   HGBA1C 7.9 (A) 02/26/2023   HGBA1C 13.5 (A) 11/08/2022   HGBA1C 7.7 08/31/2015   No results found for: FRUCTOSAMINE Lab Results  Component Value Date   CHOL 165 11/17/2022   HDL 52.10 11/17/2022   LDLCALC 87 11/17/2022   TRIG 128.0 11/17/2022   CHOLHDL 3 11/17/2022   Lab Results  Component Value Date   MICRALBCREAT 3.9 11/08/2022  MICRALBCREAT 0.2 07/14/2010   Lab Results  Component Value Date   CREATININE 0.77 11/17/2022   Lab Results  Component Value Date   GFR 87.63 11/17/2022    ASSESSMENT / PLAN  1. Type 2 diabetes mellitus with peripheral neuropathy (HCC)     Diabetes Mellitus type 2, complicated by diabetic  neuropathy. - Diabetic status / severity: Uncontrolled, improving.  Lab Results  Component Value Date   HGBA1C 7.9 (A) 02/26/2023    - Hemoglobin A1c goal : <7%  Discussed about type 2 diabetes mellitus, potential chronic complications.  Importance of controlling blood sugar.  Importance of diabetes compliance with medications.  She has improvement on diabetes control with hemoglobin A1c from 13.5 to 7.9% today.  Congratulated her.  Encouraged to continue on diet plan, increase physical activity and exercise regularly.  - Medications: See below.  I) changed to extended release metformin  for the convenience and compliance to be taken once a day.  Metformin  extended release 2000 mg daily. II) decreased pioglitazone  from 45 to 30 mg daily.  She has pedal edema.  - Home glucose testing: Advised to check in the morning fasting and at bedtime daily.  Asked to bring glucometer in follow-up visit. - Discussed/ Gave Hypoglycemia treatment plan.  # Consult : not required at this time.   # Annual urine for microalbuminuria/ creatinine ratio, no microalbuminuria currently. Last  Lab Results  Component Value Date   MICRALBCREAT 3.9 11/08/2022    # Foot check nightly / neuropathy, symptoms improving.  # Annual dilated diabetic eye exams.   - Diet: Make healthy diabetic food choices - Life style / activity / exercise: Discussed.  2. Blood pressure  -  BP Readings from Last 1 Encounters:  02/26/23 136/60    - Control is in target.  - No change in current plans.  3. Lipid status / Hyperlipidemia - Last  Lab Results  Component Value Date   LDLCALC 87 11/17/2022   -Currently not on a statin.  Diagnoses and all orders for this visit:  Type 2 diabetes mellitus with peripheral neuropathy (HCC) -     POCT glycosylated hemoglobin (Hb A1C) -     pioglitazone  (ACTOS ) 30 MG tablet; Take 1 tablet (30 mg total) by mouth daily.  Other orders -     metFORMIN  (GLUCOPHAGE -XR) 500 MG 24  hr tablet; Take 4 tablets (2,000 mg total) by mouth daily with breakfast.    DISPOSITION Follow up in clinic in 3 months suggested.   All questions answered and patient verbalized understanding of the plan.  Gao Mitnick, MD [facility] Endocrinology [facility] [address] [place], [address] [place], [place] [address] Phone # [phone]  At least part of this note was generated using voice recognition software. Inadvertent word errors may have occurred, which were not recognized during the proofreading process.

## 2023-02-26 NOTE — Patient Instructions (Signed)
 Diabetes regimen:  Change to extended metformin XR 2000 mg daily.  Decrease Actos 30 mg daily.

## 2023-03-02 ENCOUNTER — Telehealth: Payer: BC Managed Care – PPO | Admitting: Family Medicine

## 2023-03-02 DIAGNOSIS — E1142 Type 2 diabetes mellitus with diabetic polyneuropathy: Secondary | ICD-10-CM

## 2023-03-02 NOTE — Patient Instructions (Signed)
 Type 2 Diabetes Mellitus, Self-Care, Adult Caring for yourself after you have been diagnosed with type 2 diabetes (type 2 diabetes mellitus) means keeping your blood sugar (glucose) under control with a balance of: Nutrition. Exercise. Lifestyle changes. Medicines or insulin, if needed. Support from your team of health care providers and others. What are the risks? Having type 2 diabetes can put you at risk for other long-term (chronic) conditions, such as heart disease and kidney disease. Your health care provider may prescribe medicines to help prevent complications from diabetes. How to monitor your blood glucose  Check your blood glucose every day or as often as told by your health care provider. Have your A1C (hemoglobin A1C) level checked two or more times a year, or as often as told by your health care provider. Your health care provider will set personalized treatment goals for you. Generally, the goal of treatment is to maintain the following blood glucose levels: Before meals: 80-130 mg/dL (4.4-7.2 mmol/L). After meals: below 180 mg/dL (10 mmol/L). A1C level: less than 7%. How to manage hyperglycemia and hypoglycemia Hyperglycemia symptoms Hyperglycemia, also called high blood glucose, occurs when blood glucose is too high. Make sure you know the early signs of hyperglycemia, such as: Increased thirst. Hunger. Feeling very tired. Needing to urinate more often than usual. Blurry vision. Hypoglycemia symptoms Hypoglycemia, also called low blood glucose, occurs with a blood glucose level at or below 70 mg/dL (3.9 mmol/L). Diabetes medicines lower your blood glucose and can cause hypoglycemia. The risk for hypoglycemia increases during or after exercise, during sleep, during illness, and when skipping meals or not eating for a long time (fasting). It is important to know the symptoms of hypoglycemia and treat it right away. Always have a 15-gram rapid-acting carbohydrate snack with  you to treat low blood glucose. Family members and close friends should also know the symptoms and understand how to treat hypoglycemia, in case you are not able to treat yourself. Symptoms may include: Hunger. Anxiety. Sweating and feeling clammy. Dizziness or feeling light-headed. Sleepiness. Increased heart rate. Irritability. Tingling or numbness around the mouth, lips, or tongue. Restless sleep. Severe hypoglycemia is when your blood glucose level is at or below 54 mg/dL (3 mmol/L). Severe hypoglycemia is an emergency. Do not wait to see if the symptoms will go away. Get medical help right away. Call your local emergency services (911 in the [place] Do not drive yourself to the hospital. If you have severe hypoglycemia and you cannot eat or drink, you may need glucagon. A family member or close friend should learn how to check your blood glucose and how to give you glucagon. Ask your health care provider if you need to have an emergency glucagon kit available. Follow these instructions at home: Medicines Take prescribed insulin or diabetes medicines as told by your health care provider. Do not run out of insulin or other diabetes medicines. Plan ahead so you always have these available. If you use insulin, adjust your dosage based on your physical activity and what foods you eat. Your health care provider will tell you how to adjust your dosage. Take over-the-counter and prescription medicines only as told by your health care provider. Eating and drinking  What you eat and drink affects your blood glucose and your insulin dosage. Making good choices helps to control your diabetes and prevent other health problems. A healthy meal plan includes eating lean proteins, complex carbohydrates, fresh fruits and vegetables, low-fat dairy products, and healthy fats. Make an appointment  to see a registered dietitian to help you create an eating plan that is right for you. Make sure that you: Follow  instructions from your health care provider about eating or drinking restrictions. Drink enough fluid to keep your urine pale yellow. Keep a record of the carbohydrates that you eat. Do this by reading food labels and learning the standard serving sizes of foods. Follow your sick-day plan whenever you cannot eat or drink as usual. Make this plan in advance with your health care provider.  Activity Stay active. Exercise regularly, as told by your health care provider. This may include: Stretching and doing strength exercises, such as yoga or weight lifting, two or more times a week. Doing 150 minutes or more of moderate-intensity or vigorous-intensity exercise each week. This could be brisk walking, biking, or water aerobics. Spread out your activity over 3 or more days of the week. Do not go more than 2 days in a row without doing some kind of physical activity. When you start a new exercise or activity, work with your health care provider to adjust your insulin, medicines, or food intake as needed. Lifestyle Do not use any products that contain nicotine or tobacco. These products include cigarettes, chewing tobacco, and vaping devices, such as e-cigarettes. If you need help quitting, ask your health care provider. If you drink alcohol and your health care provider says that it is safe for you: Limit how much you have to: 0-1 drink a day for women who are not pregnant. 0-2 drinks a day for men. Know how much alcohol is in your drink. In the [place] one drink equals one 12 oz bottle of beer (355 mL), one 5 oz glass of wine (148 mL), or one 1 oz glass of hard liquor (44 mL). Learn to manage stress. If you need help with this, ask your health care provider. Take care of your body  Keep your immunizations up to date. In addition to getting vaccinations as told by your health care provider, it is recommended that you get vaccinated against the following illnesses: The flu (influenza). Get a flu shot  every year. Pneumonia. Hepatitis B. Schedule an eye exam soon after your diagnosis, and then one time every year after that. Check your skin and feet every day for cuts, bruises, redness, blisters, or sores. Schedule a foot exam with your health care provider once every year. Brush your teeth and gums two times a day, and floss one or more times a day. Visit your dentist one or more times every 6 months. Maintain a healthy weight. General instructions Share your diabetes management plan with people in your workplace, school, and household. Carry a medical alert card or wear medical alert jewelry. Keep all follow-up visits. This is important. Questions to ask your health care provider Should I meet with a certified diabetes care and education specialist? Where can I find a support group for people with diabetes? Where to find more information For help and guidance and for more information about diabetes, please visit: American Diabetes Association (ADA): www.diabetes.org American Association of Diabetes Care and Education Specialists (ADCES): www.diabeteseducator.org International Diabetes Federation (IDF): dconly.dk Summary Caring for yourself after you have been diagnosed with type 2 diabetes (type 2 diabetes mellitus) means keeping your blood sugar (glucose) under control with a balance of nutrition, exercise, lifestyle changes, and medicine. Check your blood glucose every day, as often as told by your health care provider. Having diabetes can put you at risk for other long-term (  chronic) conditions, such as heart disease and kidney disease. Your health care provider may prescribe medicines to help prevent complications from diabetes. Share your diabetes management plan with people in your workplace, school, and household. Keep all follow-up visits. This is important. This information is not intended to replace advice given to you by your health care provider. Make sure you discuss any  questions you have with your health care provider. Document Revised: 07/07/2020 Document Reviewed: 07/07/2020 Elsevier Patient Education  2024 [employer].

## 2023-03-02 NOTE — Progress Notes (Signed)
 Virtual Visit Consent   [name], you are scheduled for a virtual visit with a [facility] provider today. Just as with appointments in the office, your consent must be obtained to participate. Your consent will be active for this visit and any virtual visit you may have with one of our providers in the next 365 days. If you have a MyChart account, a copy of this consent can be sent to you electronically.  As this is a virtual visit, video technology does not allow for your provider to perform a traditional examination. This may limit your provider's ability to fully assess your condition. If your provider identifies any concerns that need to be evaluated in person or the need to arrange testing (such as labs, EKG, etc.), we will make arrangements to do so. Although advances in technology are sophisticated, we cannot ensure that it will always work on either your end or our end. If the connection with a video visit is poor, the visit may have to be switched to a telephone visit. With either a video or telephone visit, we are not always able to ensure that we have a secure connection.  By engaging in this virtual visit, you consent to the provision of healthcare and authorize for your insurance to be billed (if applicable) for the services provided during this visit. Depending on your insurance coverage, you may receive a charge related to this service.  I need to obtain your verbal consent now. Are you willing to proceed with your visit today? [name] has provided verbal consent on 03/02/2023 for a virtual visit (video or telephone). Loa Lamp, FNP  Date: 03/02/2023 11:57 AM  Virtual Visit via Video Note   I, Loa Lamp, connected with  [name]  ([id number], [birth year]) on 03/02/23 at 12:00 PM EST by a video-enabled telemedicine application and verified that I am speaking with the correct person using two identifiers.  Location: Patient: Virtual Visit Location Patient:  Home Provider: Virtual Visit Location Provider: Home Office   I discussed the limitations of evaluation and management by telemedicine and the availability of in person appointments. The patient expressed understanding and agreed to proceed.    History of Present Illness: [name] is a 53 y.o. who identifies as a female who was assigned female at birth, and is being seen today for glucose of 527 an hour ago. She says she drank a drink with 60 carbs she didn't realize it had that much. Glucose was just over 100 this am. She checked it again with me on the phone and it shows her glucose at 127. We discussed machine error. She will get her newer machine and monitor. It is at her house and she is at her boyfriends. [name]  HPI: HPI  Problems:  Patient Active Problem List   Diagnosis Date Noted   Chronic diarrhea 01/16/2023   Chest wall pain 01/16/2023   Financial difficulties 01/16/2023   Frequent UTI 12/05/2022   History of hepatitis C 12/05/2022   Pain of right hip 11/08/2022   Pyelonephritis 11/08/2022   Alcohol use disorder, severe, in sustained remission, in controlled environment (HCC) 11/08/2022   Type 2 diabetes mellitus with peripheral neuropathy (HCC) 09/29/2009   OBSTRUCTIVE SLEEP APNEA 12/16/2008   HYPONATREMIA, CHRONIC 12/07/2008   LEUKOCYTOSIS UNSPECIFIED 11/03/2008   MDD (major depressive disorder), recurrent episode, moderate (HCC) 11/03/2008   ALLERGIC RHINITIS 11/03/2008   HYPERTENSION 11/02/2008   SLEEP APNEA 11/02/2008   RENAL CALCULUS, HX OF 11/02/2008   BACK  PAIN 10/29/2008   TOBACCO DEPENDENCE 04/19/2006   MIGRAINE, UNSPEC., W/O INTRACTABLE MIGRAINE 04/19/2006   IRRITABLE BOWEL SYNDROME 04/19/2006   Autoimmune hepatitis (HCC) 04/19/2006    Allergies:  Allergies  Allergen Reactions   Dilaudid [Hydromorphone]    Erythromycin Nausea And Vomiting    Stomach upset   Ginseng Nausea And Vomiting and Swelling   Mushroom Extract Complex (Do Not Select) Nausea And  Vomiting   Pineapple Hives    unknown   Sulfa Antibiotics Hives and Itching    itching   Other Hives and Rash    Walnut   Medications:  Current Outpatient Medications:    Blood Glucose Monitoring Suppl DEVI, 1 each by Does not apply route in the morning, at noon, and at bedtime. May substitute to any manufacturer covered by patient's insurance., Disp: 1 each, Rfl: 0   ibuprofen  (ADVIL ,MOTRIN ) 200 MG tablet, Take 800 mg by mouth every 6 (six) hours as needed for pain., Disp: , Rfl:    metFORMIN  (GLUCOPHAGE -XR) 500 MG 24 hr tablet, Take 4 tablets (2,000 mg total) by mouth daily with breakfast., Disp: 360 tablet, Rfl: 2   nitrofurantoin , macrocrystal-monohydrate, (MACROBID ) 100 MG capsule, Take 1 capsule (100 mg total) by mouth daily., Disp: 30 capsule, Rfl: 2   pioglitazone  (ACTOS ) 30 MG tablet, Take 1 tablet (30 mg total) by mouth daily., Disp: 90 tablet, Rfl: 3  Observations/Objective: Patient is well-developed, well-nourished in no acute distress.  Resting comfortably  at home.  Head is normocephalic, atraumatic.  No labored breathing.  Speech is clear and coherent with logical content.  Patient is alert and oriented at baseline.  She is asymptomatic at this time in no distress.   Assessment and Plan: 1. Type 2 diabetes mellitus with peripheral neuropathy (HCC) (Primary)  Increase water intake, if over 500 go directly to ED. She will get her newer machine and monitor with that one.   Follow Up Instructions: I discussed the assessment and treatment plan with the patient. The patient was provided an opportunity to ask questions and all were answered. The patient agreed with the plan and demonstrated an understanding of the instructions.  A copy of instructions were sent to the patient via MyChart unless otherwise noted below.     The patient was advised to call back or seek an in-person evaluation if the symptoms worsen or if the condition fails to improve as anticipated.     [name], FNP

## 2023-03-15 ENCOUNTER — Ambulatory Visit: Payer: BC Managed Care – PPO | Admitting: Urology

## 2023-03-15 NOTE — Progress Notes (Deleted)
 Assessment: 1. Frequent UTI     Plan: Continue methods to reduce the risk of UTIs discussed including fluid intake, timed and double voiding, daily cranberry supplement, and daily probiotic. Continue management of her constipation to improve bladder emptying and reduce the risk of UTIs. Continue daily Macrobid for UTI prevention Return to office in 2 months  Chief Complaint:  No chief complaint on file.   History of Present Illness:  [name] is a 53 y.o. female who is seen for further evaluation of UTI's. She reported a history of UTI symptoms for approximately 1 year.  She did not seek medical attention as she did not have insurance. She presented to the emergency room on 10/20/2022 with abdominal pain, frequency and dysuria.  She was also having some low back pain.  No fevers or chills.  White cell count was elevated to 14.7. CT imaging showed no renal or ureteral calculi, no evidence of obstruction, subtle regions of cortical hypoenhancement in the right kidney consistent with pyelonephritis, small amount of air within the bladder. Urine culture results: 10/20/2022 >100 K E. coli, pansensitive - treated with Rocephin and Duracef 11/09/2022 >100 K E. Coli - treated with Keflex 12/04/22 >100K E. Coli - treated with Cipro  She noted improvement in her symptoms while on the antibiotics.  Her urinary symptoms return shortly after completion of the antibiotic.  She was having some intermittent frequency and dysuria.  No recent gross hematuria.  She reported noting some mucus in the urine.  She also reported passing "sand" in her urine recently.  She does report a prior history of kidney stones. She was started on daily Macrobid in 10/24.  At her visit in 11/24, she was doing well on the daily antibiotic. No UTI symptoms.  No dysuria or gross hematuria.  Portions of the above documentation were copied from a prior visit for review purposes only.   Past Medical History:  Past  Medical History:  Diagnosis Date   ALLERGIC RHINITIS    Allergy    Anemia    Anxiety    CHF (congestive heart failure) (HCC)    DEPRESSION    DIABETES MELLITUS, TYPE II    HEPATITIS C    Hx MRSA infection    HYPERTENSION    HYPONATREMIA, CHRONIC    Irritable bowel syndrome    LEUKOCYTOSIS UNSPECIFIED    chronic s/p heme eval (od[facility]wu 2010)   MIGRAINE, UNSPEC., W/O INTRACTABLE MIGRAINE    OSA (obstructive sleep apnea)    CPAP qhs   Puncture wound of right foot    penetrated 3/4 of an inch   RENAL CALCULUS, HX OF    Sleep apnea    Substance abuse (HCC)    TOBACCO DEPENDENCE    Ulcer     Past Surgical History:  Past Surgical History:  Procedure Laterality Date   ABDOMINAL HYSTERECTOMY  02/20/2005   LIVER BIOPSY  02/20/1997   TUBAL LIGATION  02/20/1993    Allergies:  Allergies  Allergen Reactions   Dilaudid [Hydromorphone]    Erythromycin Nausea And Vomiting    Stomach upset   Ginseng Nausea And Vomiting and Swelling   Mushroom Extract Complex (Do Not Select) Nausea And Vomiting   Pineapple Hives    unknown   Sulfa Antibiotics Hives and Itching    itching   Other Hives and Rash    Walnut    Family History:  Family History  Problem Relation Age of Onset   Diabetes Mother  Miscarriages / Stillbirths Mother    Obesity Mother    Mental illness Father    Alcohol abuse Father    Anxiety disorder Sister    Depression Sister    Obesity Sister    Breast cancer Maternal Grandmother    Cancer Maternal Grandmother    Alcohol abuse Other        parents   Colon cancer Other        grandparent   Lung cancer Other        grandparent   Ovarian cancer Other        grandparent   Heart disease Other        parent   Alcohol abuse Maternal Grandfather    Hearing loss Paternal Grandfather    Anxiety disorder Sister    Obesity Sister     Social History:  Social History   Tobacco Use   Smoking status: Former    Current packs/day: 0.00    Average packs/day: 1  pack/day for 25.0 years (25.0 ttl pk-yrs)    Types: Cigarettes    Quit date: 04/06/2017    Years since quitting: 5.9   Smokeless tobacco: Never  Vaping Use   Vaping status: Never Used  Substance Use Topics   Alcohol use: No   Drug use: No    ROS: Constitutional:  Negative for fever, chills, weight loss CV: Negative for chest pain, previous MI, hypertension Respiratory:  Negative for shortness of breath, wheezing, sleep apnea, frequent cough GI:  Negative for nausea, vomiting, bloody stool, GERD  Physical exam: There were no vitals taken for this visit. GENERAL APPEARANCE:  Well appearing, well developed, well nourished, NAD HEENT:  Atraumatic, normocephalic, oropharynx clear NECK:  Supple without lymphadenopathy or thyromegaly ABDOMEN:  Soft, non-tender, no masses EXTREMITIES:  Moves all extremities well, without clubbing, cyanosis, or edema NEUROLOGIC:  Alert and oriented x 3, normal gait, CN II-XII grossly intact MENTAL STATUS:  appropriate BACK:  Non-tender to palpation, No CVAT SKIN:  Warm, dry, and intact  Results: U/A:

## 2023-03-19 ENCOUNTER — Ambulatory Visit: Payer: BC Managed Care – PPO | Admitting: Urology

## 2023-03-19 ENCOUNTER — Telehealth: Payer: Self-pay | Admitting: Urology

## 2023-03-19 NOTE — Telephone Encounter (Signed)
Pt wants to know if she needs to continue taking her antibiotics, nitrofurantoin, macrocrystal-monohydrate, (MACROBID) 100 MG capsule. Pt stated she will be out of them tomorrow and she has no more refills at pharmacy. She missed last appointment with Dr. Pete Glatter but has been rescheduled for Feb. 12. Please advise. Thank You.

## 2023-03-20 ENCOUNTER — Other Ambulatory Visit: Payer: Self-pay | Admitting: Urology

## 2023-03-20 DIAGNOSIS — N390 Urinary tract infection, site not specified: Secondary | ICD-10-CM

## 2023-03-20 MED ORDER — NITROFURANTOIN MONOHYD MACRO 100 MG PO CAPS: 100.0000 mg | ORAL_CAPSULE | Freq: Every day | ORAL | 0 refills | Status: DC

## 2023-03-20 NOTE — Telephone Encounter (Signed)
Pt notified and expressed understanding

## 2023-03-22 ENCOUNTER — Ambulatory Visit
Admission: RE | Admit: 2023-03-22 | Discharge: 2023-03-22 | Disposition: A | Discharge status: Home / Self Care | Payer: BC Managed Care – PPO | Source: Ambulatory Visit | Attending: Family Medicine | Admitting: Family Medicine

## 2023-03-22 ENCOUNTER — Other Ambulatory Visit: Payer: Self-pay

## 2023-03-22 VITALS — BP 128/71 | HR 76 | Temp 98.9°F | Resp 16 | Ht 64.0 in | Wt 226.0 lb

## 2023-03-22 DIAGNOSIS — B349 Viral infection, unspecified: Secondary | ICD-10-CM | POA: Diagnosis not present

## 2023-03-22 DIAGNOSIS — R051 Acute cough: Secondary | ICD-10-CM | POA: Diagnosis not present

## 2023-03-22 NOTE — ED Triage Notes (Addendum)
Pt presents with cough x 2 days. Pt is requesting an excuse for work. Pt currently rates her overall pain a 5/10, hurts worse to talk. Pt is also reporting fatigue & a "raspy" chest. Theraflu, Robitussin, Mucinex, and Zinc are the medications taken with no relief/improvement in symptoms. Low grade fevers reported at home.

## 2023-03-22 NOTE — ED Provider Notes (Signed)
[name]    CSN: [id number] Arrival date & time: 03/22/23  1840      History   Chief Complaint Chief Complaint  Patient presents with   Cough    Need Dr's note for work - Entered by patient   Letter for School/Work    HPI [name] is a 53 y.o. female.    Cough Associated symptoms: fever   Associated symptoms: no chest pain, no chills, no diaphoresis, no headaches, no myalgias, no rash, no rhinorrhea, no shortness of breath, no sore throat and no wheezing   Cough for 3 days suspected maybe sinus.  Occasional raspy sensation in chest, low-grade fever to 99, fatigue.  Admits postnasal drip. Denies body aches, earache, sore throat, chest pain, wheezing, abdominal pain, nausea, vomiting, diarrhea.  Denies smoking. Taking over-the-counter cold medications with some relief.  Denies household contacts with illness.  Past Medical History:  Diagnosis Date   ALLERGIC RHINITIS    Allergy    Anemia    Anxiety    CHF (congestive heart failure) (HCC)    DEPRESSION    DIABETES MELLITUS, TYPE II    HEPATITIS C    Hx MRSA infection    HYPERTENSION    HYPONATREMIA, CHRONIC    Irritable bowel syndrome    LEUKOCYTOSIS UNSPECIFIED    chronic s/p heme eval ([facility]wu 2010)   MIGRAINE, UNSPEC., W/O INTRACTABLE MIGRAINE    OSA (obstructive sleep apnea)    CPAP qhs   Puncture wound of right foot    penetrated 3/4 of an inch   RENAL CALCULUS, HX OF    Sleep apnea    Substance abuse (HCC)    TOBACCO DEPENDENCE    Ulcer     Patient Active Problem List   Diagnosis Date Noted   Chronic diarrhea 01/16/2023   Chest wall pain 01/16/2023   Financial difficulties 01/16/2023   Frequent UTI 12/05/2022   History of hepatitis C 12/05/2022   Pain of right hip 11/08/2022   Pyelonephritis 11/08/2022   Alcohol use disorder, severe, in sustained remission, in controlled environment (HCC) 11/08/2022   Type 2 diabetes mellitus with peripheral neuropathy (HCC) 09/29/2009    OBSTRUCTIVE SLEEP APNEA 12/16/2008   HYPONATREMIA, CHRONIC 12/07/2008   LEUKOCYTOSIS UNSPECIFIED 11/03/2008   MDD (major depressive disorder), recurrent episode, moderate (HCC) 11/03/2008   ALLERGIC RHINITIS 11/03/2008   HYPERTENSION 11/02/2008   SLEEP APNEA 11/02/2008   RENAL CALCULUS, HX OF 11/02/2008   BACK PAIN 10/29/2008   TOBACCO DEPENDENCE 04/19/2006   MIGRAINE, UNSPEC., W/O INTRACTABLE MIGRAINE 04/19/2006   IRRITABLE BOWEL SYNDROME 04/19/2006   Autoimmune hepatitis (HCC) 04/19/2006    Past Surgical History:  Procedure Laterality Date   ABDOMINAL HYSTERECTOMY  02/20/2005   LIVER BIOPSY  02/20/1997   TUBAL LIGATION  02/20/1993    OB History   No obstetric history on file.      Home Medications    Prior to Admission medications   Medication Sig Start Date End Date Taking? Authorizing Provider  Blood Glucose Monitoring Suppl DEVI 1 each by Does not apply route in the morning, at noon, and at bedtime. May substitute to any manufacturer covered by patient's insurance. 11/08/22   Garnette Gunner, MD  ibuprofen (ADVIL,MOTRIN) 200 MG tablet Take 800 mg by mouth every 6 (six) hours as needed for pain.    [provider]  metFORMIN (GLUCOPHAGE-XR) 500 MG 24 hr tablet Take 4 tablets (2,000 mg total) by mouth daily with breakfast. 02/26/23   Thapa,  [place], MD  nitrofurantoin, macrocrystal-monohydrate, (MACROBID) 100 MG capsule Take 1 capsule (100 mg total) by mouth daily. 03/20/23   Stoneking, Danford Bad., MD  pioglitazone (ACTOS) 30 MG tablet Take 1 tablet (30 mg total) by mouth daily. 02/26/23 02/21/24  Thapa, [place], MD    Family History Family History  Problem Relation Age of Onset   Diabetes Mother    Miscarriages / [place] Mother    Obesity Mother    Mental illness Father    Alcohol abuse Father    Anxiety disorder Sister    Depression Sister    Obesity Sister    Breast cancer Maternal Grandmother    Cancer Maternal Grandmother    Alcohol abuse Other         parents   Colon cancer Other        grandparent   Lung cancer Other        grandparent   Ovarian cancer Other        grandparent   Heart disease Other        parent   Alcohol abuse Maternal Grandfather    Hearing loss Paternal Grandfather    Anxiety disorder Sister    Obesity Sister     Social History Social History   Tobacco Use   Smoking status: Former    Current packs/day: 0.00    Average packs/day: 1 pack/day for 25.0 years (25.0 ttl pk-yrs)    Types: Cigarettes    Quit date: 04/06/2017    Years since quitting: 5.9   Smokeless tobacco: Never  Vaping Use   Vaping status: Never Used  Substance Use Topics   Alcohol use: No   Drug use: No     Allergies   Dilaudid [hydromorphone], Erythromycin, Ginseng, Mushroom extract complex (do not select), Pineapple, Sulfa antibiotics, and Other   Review of Systems Review of Systems  Constitutional:  Positive for fatigue and fever. Negative for appetite change, chills and diaphoresis.  HENT:  Positive for postnasal drip. Negative for congestion, rhinorrhea and sore throat.   Respiratory:  Positive for cough. Negative for shortness of breath and wheezing.   Cardiovascular:  Negative for chest pain.  Gastrointestinal:  Negative for diarrhea, nausea and vomiting.  Musculoskeletal:  Negative for myalgias.  Skin:  Negative for rash.  Neurological:  Negative for headaches.     Physical Exam Triage Vital Signs ED Triage Vitals  Encounter Vitals Group     BP 03/22/23 1857 128/71     Systolic BP Percentile --      Diastolic BP Percentile --      Pulse Rate 03/22/23 1857 76     Resp 03/22/23 1857 16     Temp 03/22/23 1857 98.9 F (37.2 C)     Temp Source 03/22/23 1857 Oral     SpO2 03/22/23 1857 97 %     Weight 03/22/23 1905 226 lb (102.5 kg)     Height 03/22/23 1905 5\' 4"  (1.626 m)     Head Circumference --      Peak Flow --      Pain Score 03/22/23 1904 5     Pain Loc --      Pain Education --      Exclude from Growth  Chart --    No data found.  Updated Vital Signs BP 128/71 (BP Location: Right Arm)   Pulse 76   Temp 98.9 F (37.2 C) (Oral)   Resp 16   Ht 5\' 4"  (1.626 m)   Wt  226 lb (102.5 kg)   SpO2 97%   BMI 38.79 kg/m   Visual Acuity Right Eye Distance:   Left Eye Distance:   Bilateral Distance:    Right Eye Near:   Left Eye Near:    Bilateral Near:     Physical Exam Vitals and nursing note reviewed.  Constitutional:      Appearance: She is not ill-appearing.  HENT:     Head: Normocephalic and atraumatic.     Right Ear: Tympanic membrane and ear canal normal.     Left Ear: Tympanic membrane and ear canal normal.     Nose: No rhinorrhea.     Mouth/Throat:     Pharynx: Oropharynx is clear. No oropharyngeal exudate or posterior oropharyngeal erythema.  Eyes:     Conjunctiva/sclera: Conjunctivae normal.  Cardiovascular:     Rate and Rhythm: Normal rate and regular rhythm.     Heart sounds: Normal heart sounds.  Pulmonary:     Effort: Pulmonary effort is normal. No respiratory distress.     Breath sounds: Normal breath sounds. No wheezing or rales.  Musculoskeletal:     Cervical back: Neck supple.  Lymphadenopathy:     Cervical: No cervical adenopathy.  Neurological:     Mental Status: She is alert and oriented to person, place, and time.  Psychiatric:        Mood and Affect: Mood normal.      UC Treatments / Results  Labs (all labs ordered are listed, but only abnormal results are displayed) Labs Reviewed - No data to display  EKG   Radiology No results found.  Procedures Procedures (including critical care time)  Medications Ordered in UC Medications - No data to display  Initial Impression / Assessment and Plan / UC Course  I have reviewed the triage vital signs and the nursing notes.  Pertinent labs & imaging results that were available during my care of the patient were reviewed by me and considered in my medical decision making (see chart for  details).     53 year old non-smoker with cough, chest congestion low-grade fever for several days.  Denies chest pain or shortness of breath, well-appearing nontoxic afebrile lungs clear to auscultation, recommend continued use of over-the-counter medicines, warning signs and follow-up reviewed with patient Final Clinical Impressions(s) / UC Diagnoses   Final diagnoses:  None   Discharge Instructions   None    ED Prescriptions   None    PDMP not reviewed this encounter.   Meliton Rattan, [place] 03/22/23 1924

## 2023-03-22 NOTE — Discharge Instructions (Signed)
Continue your over-the-counter medications, follow-up for high fever, shaking chills, chest is of breath

## 2023-04-04 ENCOUNTER — Encounter: Payer: Self-pay | Admitting: Urology

## 2023-04-04 ENCOUNTER — Ambulatory Visit (INDEPENDENT_AMBULATORY_CARE_PROVIDER_SITE_OTHER): Payer: BC Managed Care – PPO | Admitting: Urology

## 2023-04-04 VITALS — BP 123/77 | HR 77 | Ht 64.0 in | Wt 228.0 lb

## 2023-04-04 DIAGNOSIS — R8290 Unspecified abnormal findings in urine: Secondary | ICD-10-CM

## 2023-04-04 DIAGNOSIS — Z87440 Personal history of urinary (tract) infections: Secondary | ICD-10-CM | POA: Diagnosis not present

## 2023-04-04 DIAGNOSIS — N390 Urinary tract infection, site not specified: Secondary | ICD-10-CM

## 2023-04-04 LAB — URINALYSIS, ROUTINE W REFLEX MICROSCOPIC
Bilirubin, UA: NEGATIVE
Glucose, UA: NEGATIVE
Ketones, UA: NEGATIVE
Nitrite, UA: NEGATIVE
Protein,UA: NEGATIVE
RBC, UA: NEGATIVE
Specific Gravity, UA: >1.03 — ABNORMAL HIGH (ref 1.005–1.030)
Urobilinogen, Ur: 1 mg/dL (ref 0.2–1.0)
pH, UA: 5.5 (ref 5.0–7.5)

## 2023-04-04 LAB — MICROSCOPIC EXAMINATION: RBC, Urine: NONE SEEN /[HPF] (ref 0–2)

## 2023-04-04 NOTE — Progress Notes (Signed)
Assessment: 1. Frequent UTI   2. Abnormal urine findings     Plan: Continue methods to reduce the risk of UTIs discussed including fluid intake, timed and double voiding, daily cranberry supplement, and daily probiotic. Continue management of her constipation to improve bladder emptying and reduce the risk of UTIs. Urine culture sent today - will contact her with results Return to office in 3 months  Chief Complaint:  Chief Complaint  Patient presents with   Frequent UTI    History of Present Illness:  [name] is a 53 y.o. female who is seen for further evaluation of UTI's. She reported a history of UTI symptoms for approximately 1 year.  She did not seek medical attention as she did not have insurance. She presented to the emergency room on 10/20/2022 with abdominal pain, frequency and dysuria.  She was also having some low back pain.  No fevers or chills.  White cell count was elevated to 14.7. CT imaging showed no renal or ureteral calculi, no evidence of obstruction, subtle regions of cortical hypoenhancement in the right kidney consistent with pyelonephritis, small amount of air within the bladder. Urine culture results: 10/20/2022 >100 K E. coli, pansensitive - treated with Rocephin and Duracef 11/09/2022 >100 K E. Coli - treated with Keflex 12/04/22 >100K E. Coli - treated with Cipro  She noted improvement in her symptoms while on the antibiotics.  Her urinary symptoms return shortly after completion of the antibiotic.  She was having some intermittent frequency and dysuria.  No gross hematuria.  She reported noting some mucus in the urine.  She also reported passing "sand" in her urine recently.  She does report a prior history of kidney stones. She was started on daily Macrobid in 10/24.  At her visit in 11/24, she was doing well on the daily antibiotic. No UTI symptoms.  No dysuria or gross hematuria.  She returns today for follow-up.  She discontinued the daily  Macrobid approximately 2 weeks ago.  No current UTI symptoms.  No dysuria or gross hematuria.  Portions of the above documentation were copied from a prior visit for review purposes only.   Past Medical History:  Past Medical History:  Diagnosis Date   ALLERGIC RHINITIS    Allergy    Anemia    Anxiety    CHF (congestive heart failure) (HCC)    DEPRESSION    DIABETES MELLITUS, TYPE II    HEPATITIS C    Hx MRSA infection    HYPERTENSION    HYPONATREMIA, CHRONIC    Irritable bowel syndrome    LEUKOCYTOSIS UNSPECIFIED    chronic s/p heme eval ([facility] 2010)   MIGRAINE, UNSPEC., W/O INTRACTABLE MIGRAINE    OSA (obstructive sleep apnea)    CPAP qhs   Puncture wound of right foot    penetrated 3/4 of an inch   RENAL CALCULUS, HX OF    Sleep apnea    Substance abuse (HCC)    TOBACCO DEPENDENCE    Ulcer     Past Surgical History:  Past Surgical History:  Procedure Laterality Date   ABDOMINAL HYSTERECTOMY  02/20/2005   LIVER BIOPSY  02/20/1997   TUBAL LIGATION  02/20/1993    Allergies:  Allergies  Allergen Reactions   Dilaudid [Hydromorphone]    Erythromycin Nausea And Vomiting    Stomach upset   Ginseng Nausea And Vomiting and Swelling   Mushroom Extract Complex (Obsolete) Nausea And Vomiting   Pineapple Hives    unknown  Sulfa Antibiotics Hives and Itching    itching   Other Hives and Rash    Walnut    Family History:  Family History  Problem Relation Age of Onset   Diabetes Mother    Miscarriages / Stillbirths Mother    Obesity Mother    Mental illness Father    Alcohol abuse Father    Anxiety disorder Sister    Depression Sister    Obesity Sister    Breast cancer Maternal Grandmother    Cancer Maternal Grandmother    Alcohol abuse Other        parents   Colon cancer Other        grandparent   Lung cancer Other        grandparent   Ovarian cancer Other        grandparent   Heart disease Other        parent   Alcohol abuse Maternal Grandfather     Hearing loss Paternal Grandfather    Anxiety disorder Sister    Obesity Sister     Social History:  Social History   Tobacco Use   Smoking status: Former    Current packs/day: 0.00    Average packs/day: 1 pack/day for 25.0 years (25.0 ttl pk-yrs)    Types: Cigarettes    Quit date: 04/06/2017    Years since quitting: 5.9   Smokeless tobacco: Never  Vaping Use   Vaping status: Never Used  Substance Use Topics   Alcohol use: No   Drug use: No    ROS: Constitutional:  Negative for fever, chills, weight loss CV: Negative for chest pain, previous MI, hypertension Respiratory:  Negative for shortness of breath, wheezing, sleep apnea, frequent cough GI:  Negative for nausea, vomiting, bloody stool, GERD  Physical exam: BP 123/77   Pulse 77   Ht 5\' 4"  (1.626 m)   Wt 228 lb (103.4 kg)   BMI 39.14 kg/m  GENERAL APPEARANCE:  Well appearing, well developed, well nourished, NAD HEENT:  Atraumatic, normocephalic, oropharynx clear NECK:  Supple without lymphadenopathy or thyromegaly ABDOMEN:  Soft, non-tender, no masses EXTREMITIES:  Moves all extremities well, without clubbing, cyanosis, or edema NEUROLOGIC:  Alert and oriented x 3, normal gait, CN II-XII grossly intact MENTAL STATUS:  appropriate BACK:  Non-tender to palpation, No CVAT SKIN:  Warm, dry, and intact  Results: U/A: 11-30 WBC, 0 RBC, many bacteria, nitrite negative

## 2023-04-07 LAB — URINE CULTURE

## 2023-04-08 ENCOUNTER — Encounter: Payer: Self-pay | Admitting: Urology

## 2023-04-08 MED ORDER — CIPROFLOXACIN HCL 500 MG PO TABS: 500.0000 mg | ORAL_TABLET | Freq: Two times a day (BID) | ORAL | 0 refills | Status: AC

## 2023-04-08 MED ORDER — CEPHALEXIN 500 MG PO CAPS: 500.0000 mg | ORAL_CAPSULE | Freq: Every day | ORAL | 2 refills | Status: AC

## 2023-04-08 NOTE — Addendum Note (Signed)
 Addended by: Milderd Meager on: 04/08/2023 03:53 PM   Modules accepted: Orders

## 2023-05-04 ENCOUNTER — Ambulatory Visit: Payer: BC Managed Care – PPO

## 2023-05-31 ENCOUNTER — Ambulatory Visit: Payer: BC Managed Care – PPO | Admitting: Endocrinology
